# Patient Record
Sex: Female | Born: 1995 | Race: White | Hispanic: No | Marital: Single | Smoking: Former smoker
Health system: Southern US, Community
[De-identification: ages and names within clinical notes are randomized; demographics above are authoritative.]

## PROBLEM LIST (undated history)

## (undated) ENCOUNTER — Inpatient Hospital Stay (HOSPITAL_COMMUNITY): Payer: Self-pay

## (undated) DIAGNOSIS — Z8669 Personal history of other diseases of the nervous system and sense organs: Secondary | ICD-10-CM

## (undated) DIAGNOSIS — O139 Gestational [pregnancy-induced] hypertension without significant proteinuria, unspecified trimester: Secondary | ICD-10-CM

---
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
## 2016-11-03 ENCOUNTER — Other Ambulatory Visit (HOSPITAL_COMMUNITY)
Admission: RE | Admit: 2016-11-03 | Discharge: 2016-11-03 | Disposition: A | Discharge status: Home / Self Care | Payer: Medicaid Other | Source: Ambulatory Visit | Attending: Obstetrics & Gynecology | Admitting: Obstetrics & Gynecology

## 2016-11-03 ENCOUNTER — Ambulatory Visit (INDEPENDENT_AMBULATORY_CARE_PROVIDER_SITE_OTHER): Payer: Medicaid Other | Admitting: Obstetrics & Gynecology

## 2016-11-03 ENCOUNTER — Encounter: Payer: Self-pay | Admitting: Obstetrics & Gynecology

## 2016-11-03 VITALS — BP 117/80 | HR 79 | Ht 63.5 in | Wt 139.0 lb

## 2016-11-03 DIAGNOSIS — Z3400 Encounter for supervision of normal first pregnancy, unspecified trimester: Secondary | ICD-10-CM | POA: Diagnosis present

## 2016-11-03 DIAGNOSIS — Z3A00 Weeks of gestation of pregnancy not specified: Secondary | ICD-10-CM | POA: Diagnosis not present

## 2016-11-03 DIAGNOSIS — G959 Disease of spinal cord, unspecified: Secondary | ICD-10-CM

## 2016-11-03 DIAGNOSIS — O98819 Other maternal infectious and parasitic diseases complicating pregnancy, unspecified trimester: Secondary | ICD-10-CM | POA: Insufficient documentation

## 2016-11-03 DIAGNOSIS — Z8669 Personal history of other diseases of the nervous system and sense organs: Secondary | ICD-10-CM

## 2016-11-03 DIAGNOSIS — B9689 Other specified bacterial agents as the cause of diseases classified elsewhere: Secondary | ICD-10-CM | POA: Insufficient documentation

## 2016-11-03 DIAGNOSIS — Z3401 Encounter for supervision of normal first pregnancy, first trimester: Secondary | ICD-10-CM | POA: Diagnosis not present

## 2016-11-03 DIAGNOSIS — N760 Acute vaginitis: Secondary | ICD-10-CM | POA: Diagnosis not present

## 2016-11-03 HISTORY — DX: Personal history of other diseases of the nervous system and sense organs: Z86.69

## 2016-11-03 NOTE — Patient Instructions (Signed)
First Trimester of Pregnancy The first trimester of pregnancy is from week 1 until the end of week 13 (months 1 through 3). A week after a sperm fertilizes an egg, the egg will implant on the wall of the uterus. This embryo will begin to develop into a baby. Genes from you and your partner will form the baby. The female genes will determine whether the baby will be a boy or a girl. At 6-8 weeks, the eyes and face will be formed, and the heartbeat can be seen on ultrasound. At the end of 12 weeks, all the baby's organs will be formed. Now that you are pregnant, you will want to do everything you can to have a healthy baby. Two of the most important things are to get good prenatal care and to follow your health care provider's instructions. Prenatal care is all the medical care you receive before the baby's birth. This care will help prevent, find, and treat any problems during the pregnancy and childbirth. Body changes during your first trimester Your body goes through many changes during pregnancy. The changes vary from woman to woman.  You may gain or lose a couple of pounds at first.  You may feel sick to your stomach (nauseous) and you may throw up (vomit). If the vomiting is uncontrollable, call your health care provider.  You may tire easily.  You may develop headaches that can be relieved by medicines. All medicines should be approved by your health care provider.  You may urinate more often. Painful urination may mean you have a bladder infection.  You may develop heartburn as a result of your pregnancy.  You may develop constipation because certain hormones are causing the muscles that push stool through your intestines to slow down.  You may develop hemorrhoids or swollen veins (varicose veins).  Your breasts may begin to grow larger and become tender. Your nipples may stick out more, and the tissue that surrounds them (areola) may become darker.  Your gums may bleed and may be  sensitive to brushing and flossing.  Dark spots or blotches (chloasma, mask of pregnancy) may develop on your face. This will likely fade after the baby is born.  Your menstrual periods will stop.  You may have a loss of appetite.  You may develop cravings for certain kinds of food.  You may have changes in your emotions from day to day, such as being excited to be pregnant or being concerned that something may go wrong with the pregnancy and baby.  You may have more vivid and strange dreams.  You may have changes in your hair. These can include thickening of your hair, rapid growth, and changes in texture. Some women also have hair loss during or after pregnancy, or hair that feels dry or thin. Your hair will most likely return to normal after your baby is born.  What to expect at prenatal visits During a routine prenatal visit:  You will be weighed to make sure you and the baby are growing normally.  Your blood pressure will be taken.  Your abdomen will be measured to track your baby's growth.  The fetal heartbeat will be listened to between weeks 10 and 14 of your pregnancy.  Test results from any previous visits will be discussed.  Your health care provider may ask you:  How you are feeling.  If you are feeling the baby move.  If you have had any abnormal symptoms, such as leaking fluid, bleeding, severe headaches,   or abdominal cramping.  If you are using any tobacco products, including cigarettes, chewing tobacco, and electronic cigarettes.  If you have any questions.  Other tests that may be performed during your first trimester include:  Blood tests to find your blood type and to check for the presence of any previous infections. The tests will also be used to check for low iron levels (anemia) and protein on red blood cells (Rh antibodies). Depending on your risk factors, or if you previously had diabetes during pregnancy, you may have tests to check for high blood  sugar that affects pregnant women (gestational diabetes).  Urine tests to check for infections, diabetes, or protein in the urine.  An ultrasound to confirm the proper growth and development of the baby.  Fetal screens for spinal cord problems (spina bifida) and Down syndrome.  HIV (human immunodeficiency virus) testing. Routine prenatal testing includes screening for HIV, unless you choose not to have this test.  You may need other tests to make sure you and the baby are doing well.  Follow these instructions at home: Medicines  Follow your health care provider's instructions regarding medicine use. Specific medicines may be either safe or unsafe to take during pregnancy.  Take a prenatal vitamin that contains at least 600 micrograms (mcg) of folic acid.  If you develop constipation, try taking a stool softener if your health care provider approves. Eating and drinking  Eat a balanced diet that includes fresh fruits and vegetables, whole grains, good sources of protein such as meat, eggs, or tofu, and low-fat dairy. Your health care provider will help you determine the amount of weight gain that is right for you.  Avoid raw meat and uncooked cheese. These carry germs that can cause birth defects in the baby.  Eating four or five small meals rather than three large meals a day may help relieve nausea and vomiting. If you start to feel nauseous, eating a few soda crackers can be helpful. Drinking liquids between meals, instead of during meals, also seems to help ease nausea and vomiting.  Limit foods that are high in fat and processed sugars, such as fried and sweet foods.  To prevent constipation: ? Eat foods that are high in fiber, such as fresh fruits and vegetables, whole grains, and beans. ? Drink enough fluid to keep your urine clear or pale yellow. Activity  Exercise only as directed by your health care provider. Most women can continue their usual exercise routine during  pregnancy. Try to exercise for 30 minutes at least 5 days a week. Exercising will help you: ? Control your weight. ? Stay in shape. ? Be prepared for labor and delivery.  Experiencing pain or cramping in the lower abdomen or lower back is a good sign that you should stop exercising. Check with your health care provider before continuing with normal exercises.  Try to avoid standing for long periods of time. Move your legs often if you must stand in one place for a long time.  Avoid heavy lifting.  Wear low-heeled shoes and practice good posture.  You may continue to have sex unless your health care provider tells you not to. Relieving pain and discomfort  Wear a good support bra to relieve breast tenderness.  Take warm sitz baths to soothe any pain or discomfort caused by hemorrhoids. Use hemorrhoid cream if your health care provider approves.  Rest with your legs elevated if you have leg cramps or low back pain.  If you develop   varicose veins in your legs, wear support hose. Elevate your feet for 15 minutes, 3-4 times a day. Limit salt in your diet. Prenatal care  Schedule your prenatal visits by the twelfth week of pregnancy. They are usually scheduled monthly at first, then more often in the last 2 months before delivery.  Write down your questions. Take them to your prenatal visits.  Keep all your prenatal visits as told by your health care provider. This is important. Safety  Wear your seat belt at all times when driving.  Make a list of emergency phone numbers, including numbers for family, friends, the hospital, and police and fire departments. General instructions  Ask your health care provider for a referral to a local prenatal education class. Begin classes no later than the beginning of month 6 of your pregnancy.  Ask for help if you have counseling or nutritional needs during pregnancy. Your health care provider can offer advice or refer you to specialists for help  with various needs.  Do not use hot tubs, steam rooms, or saunas.  Do not douche or use tampons or scented sanitary pads.  Do not cross your legs for long periods of time.  Avoid cat litter boxes and soil used by cats. These carry germs that can cause birth defects in the baby and possibly loss of the fetus by miscarriage or stillbirth.  Avoid all smoking, herbs, alcohol, and medicines not prescribed by your health care provider. Chemicals in these products affect the formation and growth of the baby.  Do not use any products that contain nicotine or tobacco, such as cigarettes and e-cigarettes. If you need help quitting, ask your health care provider. You may receive counseling support and other resources to help you quit.  Schedule a dentist appointment. At home, brush your teeth with a soft toothbrush and be gentle when you floss. Contact a health care provider if:  You have dizziness.  You have mild pelvic cramps, pelvic pressure, or nagging pain in the abdominal area.  You have persistent nausea, vomiting, or diarrhea.  You have a bad smelling vaginal discharge.  You have pain when you urinate.  You notice increased swelling in your face, hands, legs, or ankles.  You are exposed to fifth disease or chickenpox.  You are exposed to German measles (rubella) and have never had it. Get help right away if:  You have a fever.  You are leaking fluid from your vagina.  You have spotting or bleeding from your vagina.  You have severe abdominal cramping or pain.  You have rapid weight gain or loss.  You vomit blood or material that looks like coffee grounds.  You develop a severe headache.  You have shortness of breath.  You have any kind of trauma, such as from a fall or a car accident. Summary  The first trimester of pregnancy is from week 1 until the end of week 13 (months 1 through 3).  Your body goes through many changes during pregnancy. The changes vary from  woman to woman.  You will have routine prenatal visits. During those visits, your health care provider will examine you, discuss any test results you may have, and talk with you about how you are feeling. This information is not intended to replace advice given to you by your health care provider. Make sure you discuss any questions you have with your health care provider. Document Released: 01/27/2001 Document Revised: 01/15/2016 Document Reviewed: 01/15/2016 Elsevier Interactive Patient Education  2017 [employer]   Inc.  

## 2016-11-03 NOTE — Progress Notes (Signed)
Subjective:    Allison Heath is a G1P0 [redacted]w[redacted]d being seen today for her first obstetrical visit.  Her obstetrical history is significant for first pregnancy with unsure LMP. Patient does intend to breast feed. Pregnancy history fully reviewed.  Patient reports nausea.  Vitals:   11/03/16 1010 11/03/16 1011  BP: 117/80   Pulse: 79   Weight: 139 lb (63 kg)   Height:  5' 3.5" (1.613 m)    HISTORY: OB History  Gravida Para Term Preterm AB Living  1            SAB TAB Ectopic Multiple Live Births               # Outcome Date GA Lbr Len/2nd Weight Sex Delivery Anes PTL Lv  1 Current              Past Medical History:  Diagnosis Date  . Supervision of normal first pregnancy, antepartum 11/03/2016    Clinic [facility] Prenatal Labs Dating LMP Blood type:    Genetic Screen 1 Screen:    AFP:     Quad:     NIPS: Antibody:  Anatomic [place]  Rubella:   GTT Early:               Third trimester:  RPR:    Flu vaccine  HBsAg:    TDaP vaccine                                               Rhogam: HIV:    Baby Food Breast                                              GBS: (For PCN allergy, check sensitivities) Contraception Pill? Pap: Circumcision Yes if a boy  Pediatrician Undecided CF: Support Person Cody-FOB Susan-Mom SMA Prenatal Classes Yes Hgb electrophoresis:     Past Surgical History:  Procedure Laterality Date  . birth mark removal  1997  . SPINAL CORD UNTETHERING  2006   Family History  Problem Relation Age of Onset  . Multiple sclerosis Mother   . Addison's disease Mother   . Graves' disease Mother   . Fibromyalgia Mother   . Bone cancer Maternal Grandmother   . Emphysema Maternal Grandfather      Exam    Uterus:   11-2 week  Pelvic Exam:    Perineum: No Hemorrhoids   Vulva: normal   Vagina:  normal mucosa   pH:     Cervix: no lesions   Adnexa: normal adnexa   Bony Pelvis: average  System: Breast:  normal appearance, no masses or tenderness   Skin: normal coloration and  turgor, no rashes    Neurologic: oriented, normal mood   Extremities: normal strength, tone, and muscle mass   HEENT PERRLA, extra ocular movement intact, sclera clear, anicteric, oropharynx clear, no lesions, neck supple with midline trachea and thyroid without masses   Mouth/Teeth mucous membranes moist, pharynx normal without lesions and dental hygiene good   Neck supple   Cardiovascular: regular rate and rhythm, no murmurs or gallops   Respiratory:  appears well, vitals normal, no respiratory distress, acyanotic, normal RR, neck free of mass or lymphadenopathy, chest clear, no wheezing, crepitations, rhonchi,  normal symmetric air entry   Abdomen: soft, non-tender; bowel sounds normal; no masses,  no organomegaly   Urinary: urethral meatus normal      Assessment:    Pregnancy: G1P0 Patient Active Problem List   Diagnosis Date Noted  . Supervision of normal first pregnancy, antepartum 11/03/2016        Plan:     Initial labs drawn. Prenatal vitamins. Problem list reviewed and updated. Genetic Screening discussed First Screen: ordered.  Ultrasound discussed; fetal survey: 18+ weeks.  Follow up in 4 weeks. 50% of 30 min visit spent on counseling and coordination of care.     [name] 11/03/2016

## 2016-11-05 LAB — CULTURE, OB URINE

## 2016-11-05 LAB — CERVICOVAGINAL ANCILLARY ONLY
BACTERIAL VAGINITIS: POSITIVE — AB
Candida vaginitis: NEGATIVE
Chlamydia: NEGATIVE
NEISSERIA GONORRHEA: NEGATIVE
TRICH (WINDOWPATH): NEGATIVE

## 2016-11-05 LAB — URINE CULTURE, OB REFLEX

## 2016-11-05 LAB — CYTOLOGY - PAP: Diagnosis: NEGATIVE

## 2016-11-10 LAB — OBSTETRIC PANEL, INCLUDING HIV
Antibody Screen: NEGATIVE
BASOS ABS: 0 10*3/uL (ref 0.0–0.2)
Basos: 0 %
EOS (ABSOLUTE): 0 10*3/uL (ref 0.0–0.4)
Eos: 1 %
HIV SCREEN 4TH GENERATION: NONREACTIVE
Hematocrit: 33.9 % — ABNORMAL LOW (ref 34.0–46.6)
Hemoglobin: 11.3 g/dL (ref 11.1–15.9)
Hepatitis B Surface Ag: NEGATIVE
Immature Grans (Abs): 0 10*3/uL (ref 0.0–0.1)
Immature Granulocytes: 0 %
LYMPHS ABS: 1.7 10*3/uL (ref 0.7–3.1)
Lymphs: 28 %
MCH: 32.8 pg (ref 26.6–33.0)
MCHC: 33.3 g/dL (ref 31.5–35.7)
MCV: 99 fL — AB (ref 79–97)
Monocytes Absolute: 0.3 10*3/uL (ref 0.1–0.9)
Monocytes: 6 %
NEUTROS ABS: 3.9 10*3/uL (ref 1.4–7.0)
Neutrophils: 65 %
PLATELETS: 223 10*3/uL (ref 150–379)
RBC: 3.44 x10E6/uL — ABNORMAL LOW (ref 3.77–5.28)
RDW: 13.2 % (ref 12.3–15.4)
RPR Ser Ql: NONREACTIVE
Rh Factor: POSITIVE
Rubella Antibodies, IGG: 5.68 index (ref >0.99)
WBC: 6 10*3/uL (ref 3.4–10.8)

## 2016-11-10 LAB — CYSTIC FIBROSIS MUTATION 97: GENE DIS ANAL CARRIER INTERP BLD/T-IMP: NOT DETECTED

## 2016-11-10 LAB — VITAMIN D 25 HYDROXY (VIT D DEFICIENCY, FRACTURES): Vit D, 25-Hydroxy: 27 ng/mL — ABNORMAL LOW (ref 30.0–100.0)

## 2016-11-10 LAB — VARICELLA ZOSTER ANTIBODY, IGG: Varicella zoster IgG: 499 index (ref >165)

## 2016-11-18 ENCOUNTER — Encounter (HOSPITAL_COMMUNITY): Payer: Self-pay

## 2016-11-18 ENCOUNTER — Ambulatory Visit (HOSPITAL_COMMUNITY)
Admission: RE | Admit: 2016-11-18 | Discharge: 2016-11-18 | Disposition: A | Discharge status: Home / Self Care | Payer: Medicaid Other | Source: Ambulatory Visit | Attending: Obstetrics & Gynecology | Admitting: Obstetrics & Gynecology

## 2016-11-18 DIAGNOSIS — Z3400 Encounter for supervision of normal first pregnancy, unspecified trimester: Secondary | ICD-10-CM

## 2016-11-18 DIAGNOSIS — Z3A13 13 weeks gestation of pregnancy: Secondary | ICD-10-CM | POA: Insufficient documentation

## 2016-11-18 DIAGNOSIS — Z3682 Encounter for antenatal screening for nuchal translucency: Secondary | ICD-10-CM | POA: Diagnosis not present

## 2016-11-23 ENCOUNTER — Other Ambulatory Visit: Payer: Self-pay

## 2016-11-23 DIAGNOSIS — Z3400 Encounter for supervision of normal first pregnancy, unspecified trimester: Secondary | ICD-10-CM

## 2016-12-01 ENCOUNTER — Ambulatory Visit (INDEPENDENT_AMBULATORY_CARE_PROVIDER_SITE_OTHER): Payer: Medicaid Other | Admitting: Obstetrics & Gynecology

## 2016-12-01 VITALS — BP 108/68 | HR 89 | Wt 146.0 lb

## 2016-12-01 DIAGNOSIS — Z3400 Encounter for supervision of normal first pregnancy, unspecified trimester: Secondary | ICD-10-CM

## 2016-12-01 DIAGNOSIS — Z3402 Encounter for supervision of normal first pregnancy, second trimester: Secondary | ICD-10-CM

## 2016-12-01 DIAGNOSIS — Z23 Encounter for immunization: Secondary | ICD-10-CM | POA: Diagnosis not present

## 2016-12-01 NOTE — Patient Instructions (Addendum)

## 2016-12-01 NOTE — Progress Notes (Signed)
   PRENATAL VISIT NOTE  Subjective:  Allison Heath is a 21 y.o. G1P0 at [redacted]w[redacted]d being seen today for ongoing prenatal care.  She is currently monitored for the following issues for this low-risk pregnancy and has Supervision of normal first pregnancy, antepartum and History of tethered spinal cord on her problem list.  Patient reports no complaints.  Contractions: Not present. Vag. Bleeding: None.  Movement: Present. Denies leaking of fluid.   The following portions of the patient's history were reviewed and updated as appropriate: allergies, current medications, past family history, past medical history, past social history, past surgical history and problem list. Problem list updated.  Objective:   Vitals:   12/01/16 0958  BP: 108/68  Pulse: 89  Weight: 66.2 kg (146 lb)    Fetal Status:     Movement: Present     General:  Alert, oriented and cooperative. Patient is in no acute distress.  Skin: Skin is warm and dry. No rash noted.   Cardiovascular: Normal heart rate noted  Respiratory: Normal respiratory effort, no problems with respiration noted  Abdomen: Soft, gravid, appropriate for gestational age.  Pain/Pressure: Absent     Pelvic: Cervical exam deferred        Extremities: Normal range of motion.  Edema: None  Mental Status:  Normal mood and affect. Normal behavior. Normal judgment and thought content.   Assessment and Plan:  Pregnancy: G1P0 at [redacted]w[redacted]d  1. Supervision of normal first pregnancy, antepartum  - Flu Vaccine QUAD 36+ mos IM (Fluarix, Quad PF) - [place] MFM OB COMP + 14 WK; Future - AFP, Serum, Open Spina Bifida  Preterm labor symptoms and general obstetric precautions including but not limited to vaginal bleeding, contractions, leaking of fluid and fetal movement were reviewed in detail with the patient. Please refer to After Visit Summary for other counseling recommendations.  Return in about 4 weeks (around 12/29/2016).   [name], MD

## 2016-12-03 ENCOUNTER — Other Ambulatory Visit: Payer: Self-pay

## 2016-12-03 MED ORDER — METRONIDAZOLE 500 MG PO TABS: 500.0000 mg | ORAL_TABLET | Freq: Two times a day (BID) | ORAL | 0 refills | Status: AC

## 2016-12-10 ENCOUNTER — Encounter (HOSPITAL_COMMUNITY): Payer: Self-pay | Admitting: Emergency Medicine

## 2016-12-10 ENCOUNTER — Emergency Department (HOSPITAL_COMMUNITY)
Admission: EM | Admit: 2016-12-10 | Discharge: 2016-12-10 | Disposition: A | Discharge status: Home / Self Care | Payer: Medicaid Other | Attending: Emergency Medicine | Admitting: Emergency Medicine

## 2016-12-10 DIAGNOSIS — F17200 Nicotine dependence, unspecified, uncomplicated: Secondary | ICD-10-CM | POA: Diagnosis not present

## 2016-12-10 DIAGNOSIS — O99332 Smoking (tobacco) complicating pregnancy, second trimester: Secondary | ICD-10-CM | POA: Insufficient documentation

## 2016-12-10 DIAGNOSIS — R109 Unspecified abdominal pain: Secondary | ICD-10-CM

## 2016-12-10 DIAGNOSIS — O26892 Other specified pregnancy related conditions, second trimester: Secondary | ICD-10-CM

## 2016-12-10 DIAGNOSIS — R110 Nausea: Secondary | ICD-10-CM | POA: Insufficient documentation

## 2016-12-10 DIAGNOSIS — O9989 Other specified diseases and conditions complicating pregnancy, childbirth and the puerperium: Secondary | ICD-10-CM | POA: Diagnosis not present

## 2016-12-10 DIAGNOSIS — Z3A16 16 weeks gestation of pregnancy: Secondary | ICD-10-CM | POA: Diagnosis not present

## 2016-12-10 DIAGNOSIS — R1033 Periumbilical pain: Secondary | ICD-10-CM | POA: Diagnosis not present

## 2016-12-10 NOTE — ED Notes (Signed)
ED Provider at bedside. 

## 2016-12-10 NOTE — Discharge Instructions (Signed)
You can take Tylenol or ibuprofen for pain. Follow closely with Dr. Debroah LoopArnold and return for emergent evaluation (here [facility] if symptoms change or you have new concerns.

## 2016-12-10 NOTE — ED Provider Notes (Signed)
[facility]-EMERGENCY DEPT Provider Note   CSN: [id number] Arrival date & time: 12/10/16  2044     History   Chief Complaint Chief Complaint  Patient presents with  . Abdominal Cramping    HPI Allison Heath is a 21 y.o. female.  Patient presents with abdominal cramping pain in periumbilical abdomen for the past 3 hours. She is approximately [redacted] weeks pregnant in low-risk pregnancy, followed by Dr. Debroah LoopArnold. No vaginal bleeding or fluid leakage. The baby remains active. She has nausea but this is unchanged from nausea experienced during the pregnancy already. No fever, vomiting. She reports she may be constipated but continues to have bowel movements. She has been taking Flagyl for 5 days for BV and reports symptoms of discharge on initial diagnosis have resolved.    The history is provided by the patient and the spouse. No language interpreter was used.  Abdominal Cramping  Associated symptoms include abdominal pain. Pertinent negatives include no chest pain and no shortness of breath.    Past Medical History:  Diagnosis Date  . Medical history non-contributory   . Supervision of normal first pregnancy, antepartum 11/03/2016    Clinic [facility] Prenatal Labs Dating LMP Blood type:    Genetic Screen 1 Screen:    AFP:     Quad:     NIPS: Antibody:  Anatomic US  Rubella:   GTT Early:               Third trimester:  RPR:    Flu vaccine  HBsAg:    TDaP vaccine                                               Rhogam: HIV:    Baby Food Breast                                              GBS: (For PCN allergy, check sensitivities) Contraception Pill? Pap: Circumcision Yes if a boy  Pediatrician Undecided CF: Support Person Cody-FOB Susan-Mom SMA Prenatal Classes Yes Hgb electrophoresis:      Patient Active Problem List   Diagnosis Date Noted  . Supervision of normal first pregnancy, antepartum 11/03/2016  . History of tethered spinal cord 11/03/2016    Past Surgical  History:  Procedure Laterality Date  . birth mark removal  1997  . SPINAL CORD UNTETHERING  2006    OB History    Gravida Para Term Preterm AB Living   1             SAB TAB Ectopic Multiple Live Births                   Home Medications    Prior to Admission medications   Medication Sig Start Date End Date Taking? Authorizing Provider  metroNIDAZOLE (FLAGYL) 500 MG tablet Take 1 tablet (500 mg total) by mouth 2 (two) times daily. 12/03/16 12/10/16 Yes Brock BadHarper, Charles A, MD  Prenatal Vit-Fe Fumarate-FA (PRENATAL MULTIVITAMIN) TABS tablet Take 1 tablet by mouth daily.    Yes [provider]    Family History Family History  Problem Relation Age of Onset  . Multiple sclerosis Mother   . Addison's disease Mother   . Graves' disease  Mother   . Fibromyalgia Mother   . Bone cancer Maternal Grandmother   . Emphysema Maternal Grandfather     Social History Social History  Substance Use Topics  . Smoking status: Former Smoker    Quit date: 07/2016  . Smokeless tobacco: Never Used  . Alcohol use No     Allergies   Patient has no known allergies.   Review of Systems Review of Systems  Constitutional: Negative for chills and fever.  Respiratory: Negative.  Negative for shortness of breath.   Cardiovascular: Negative.  Negative for chest pain.  Gastrointestinal: Positive for abdominal distention, abdominal pain and nausea. Negative for blood in stool, diarrhea and vomiting.  Genitourinary: Negative.   Musculoskeletal: Negative.  Negative for back pain.  Neurological: Negative.      Physical Exam Updated Vital Signs BP 137/89 (BP Location: Left Arm)   Pulse 75   Temp 98.3 F (36.8 C) (Oral)   Resp 18   Ht 5\' 3"  (1.6 m)   Wt 67.6 kg (149 lb)   LMP 08/19/2016 (Approximate)   SpO2 100%   BMI 26.39 kg/m   Physical Exam  Constitutional: She is oriented to person, place, and time. She appears well-developed and well-nourished.  HENT:  Head:  Normocephalic.  Neck: Normal range of motion. Neck supple.  Cardiovascular: Normal rate.   Pulmonary/Chest: Effort normal.  Abdominal: Soft. Bowel sounds are normal. There is tenderness (Mild periumbilical tenderness to gravid but soft abdomen.). There is no rebound and no guarding.  Musculoskeletal: Normal range of motion.  Neurological: She is alert and oriented to person, place, and time.  Skin: Skin is warm and dry. No rash noted.  Psychiatric: She has a normal mood and affect.     ED Treatments / Results  Labs (all labs ordered are listed, but only abnormal results are displayed) Labs Reviewed - No data to display  EKG  EKG Interpretation None       Radiology No results found.  Procedures Procedures (including critical care time)  Medications Ordered in ED Medications - No data to display   Initial Impression / Assessment and Plan / ED Course  I have reviewed the triage vital signs and the nursing notes.  Pertinent labs & imaging results that were available during my care of the patient were reviewed by me and considered in my medical decision making (see chart for details).     16 week G1P0 patient presents with abdominal cramping that started 3 hours ago. Good fetal movement. FHT's of 150 in ED.   Discussed the patient with Dr. Debroah Loop [facility]) who advises nothing more needs to be done currently. She is having regular OB prenatal care and is considered low-risk. No bleeding or fluid leakage. Good FHT's and movement - no [place] indicated. He reports, based on presentation, round ligament pain is likely requiring patient reassurance, Tylenol or ibuprofen and office follow up.   She can be discharged home. Return precautions discussed. She is told she can go to [facility] for emergent concerns regarding the pregnancy but is also told she can return here anytime she feels she needs medical care.   Final Clinical Impressions(s) / ED Diagnoses   Final diagnoses:  None   1.  Abdominal cramping.  New Prescriptions New Prescriptions   No medications on file     Elpidio Anis, Cordelia Poche 12/10/16 2250    Raeford Razor, MD 12/15/16 [phone]

## 2016-12-10 NOTE — ED Triage Notes (Addendum)
Pt reports having abdominal cramping that started appx 45 min ago that started under ribs and proceeded all over abdomen that feels tight. Pt reports being [redacted]weeks pregnant and is being seen by OB/GYN for prenatal care. Pt denies any vaginal bleeding.

## 2016-12-23 ENCOUNTER — Ambulatory Visit (HOSPITAL_COMMUNITY)
Admission: RE | Admit: 2016-12-23 | Discharge: 2016-12-23 | Disposition: A | Discharge status: Home / Self Care | Payer: Medicaid Other | Source: Ambulatory Visit | Attending: Obstetrics & Gynecology | Admitting: Obstetrics & Gynecology

## 2016-12-23 ENCOUNTER — Encounter (HOSPITAL_COMMUNITY): Payer: Self-pay

## 2016-12-23 ENCOUNTER — Other Ambulatory Visit: Payer: Self-pay | Admitting: Obstetrics & Gynecology

## 2016-12-23 DIAGNOSIS — Z3400 Encounter for supervision of normal first pregnancy, unspecified trimester: Secondary | ICD-10-CM

## 2016-12-23 DIAGNOSIS — Z363 Encounter for antenatal screening for malformations: Secondary | ICD-10-CM | POA: Diagnosis present

## 2016-12-23 DIAGNOSIS — Z9889 Other specified postprocedural states: Secondary | ICD-10-CM | POA: Insufficient documentation

## 2016-12-23 DIAGNOSIS — Z3A18 18 weeks gestation of pregnancy: Secondary | ICD-10-CM | POA: Diagnosis not present

## 2016-12-23 DIAGNOSIS — O2692 Pregnancy related conditions, unspecified, second trimester: Secondary | ICD-10-CM | POA: Insufficient documentation

## 2016-12-23 DIAGNOSIS — O09892 Supervision of other high risk pregnancies, second trimester: Secondary | ICD-10-CM | POA: Insufficient documentation

## 2016-12-23 DIAGNOSIS — Z8669 Personal history of other diseases of the nervous system and sense organs: Secondary | ICD-10-CM | POA: Diagnosis present

## 2016-12-23 DIAGNOSIS — Z87798 Personal history of other (corrected) congenital malformations: Secondary | ICD-10-CM | POA: Insufficient documentation

## 2016-12-24 LAB — AFP, SERUM, OPEN SPINA BIFIDA
AFP MoM: 1.02
AFP Value: 34.1 ng/mL
GEST. AGE ON COLLECTION DATE: 16 wk
MATERNAL AGE AT EDD: 24.1 a
OSBR RISK 1 IN: 10000
TEST RESULTS AFP: NEGATIVE
Weight: 146 [lb_av]

## 2016-12-29 ENCOUNTER — Encounter: Payer: Self-pay | Admitting: Obstetrics and Gynecology

## 2016-12-29 ENCOUNTER — Ambulatory Visit (INDEPENDENT_AMBULATORY_CARE_PROVIDER_SITE_OTHER): Payer: Medicaid Other | Admitting: Obstetrics and Gynecology

## 2016-12-29 VITALS — BP 115/79 | HR 75 | Wt 156.0 lb

## 2016-12-29 DIAGNOSIS — Z3400 Encounter for supervision of normal first pregnancy, unspecified trimester: Secondary | ICD-10-CM

## 2016-12-29 NOTE — Progress Notes (Signed)
Subjective:  Allison Heath is a 21 y.o. G1P0 at 1044w6d being seen today for ongoing prenatal care.  She is currently monitored for the following issues for this low risk pregnancy and has Supervision of normal first pregnancy, antepartum and History of tethered spinal cord on their problem list.  Patient reports no complaints.  Contractions: Not present. Vag. Bleeding: None.  Movement: Present. Denies leaking of fluid.   The following portions of the patient's history were reviewed and updated as appropriate: allergies, current medications, past family history, past medical history, past social history, past surgical history and problem list. Problem list updated.  Objective:   Vitals:   12/29/16 1354  BP: 115/79  Pulse: 75  Weight: 156 lb (70.8 kg)    Fetal Status: Fetal Heart Rate (bpm): 152   Movement: Present     General:  Alert, oriented and cooperative. Patient is in no acute distress.  Skin: Skin is warm and dry. No rash noted.   Cardiovascular: Normal heart rate noted  Respiratory: Normal respiratory effort, no problems with respiration noted  Abdomen: Soft, gravid, appropriate for gestational age. Pain/Pressure: Absent     Pelvic:  Cervical exam deferred        Extremities: Normal range of motion.  Edema: Trace  Mental Status: Normal mood and affect. Normal behavior. Normal judgment and thought content.   Urinalysis:      Assessment and Plan:  Pregnancy: G1P0 at 7444w6d  1. Supervision of normal first pregnancy, antepartum Stable  Preterm labor symptoms and general obstetric precautions including but not limited to vaginal bleeding, contractions, leaking of fluid and fetal movement were reviewed in detail with the patient. Please refer to After Visit Summary for other counseling recommendations.  Return in about 4 weeks (around 01/26/2017) for OB visit.   Hermina StaggersErvin, Luverna Degenhart L, MD

## 2016-12-29 NOTE — Patient Instructions (Signed)

## 2017-01-26 ENCOUNTER — Encounter: Payer: Medicaid Other | Admitting: Obstetrics & Gynecology

## 2017-02-02 ENCOUNTER — Ambulatory Visit (INDEPENDENT_AMBULATORY_CARE_PROVIDER_SITE_OTHER): Payer: Medicaid Other | Admitting: Obstetrics & Gynecology

## 2017-02-02 DIAGNOSIS — Z3400 Encounter for supervision of normal first pregnancy, unspecified trimester: Secondary | ICD-10-CM

## 2017-02-02 MED ORDER — PANTOPRAZOLE SODIUM 20 MG PO TBEC: 20.0000 mg | DELAYED_RELEASE_TABLET | Freq: Every day | ORAL | 3 refills | Status: AC

## 2017-02-02 NOTE — Progress Notes (Signed)
   PRENATAL VISIT NOTE  Subjective:  Allison Heath is a 21 y.o. G1P0 at 3148w2d being seen today for ongoing prenatal care.  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  Patient reports backache and heartburn.  Contractions: Not present. Vag. Bleeding: None.  Movement: Present. Denies leaking of fluid.   The following portions of the patient's history were reviewed and updated as appropriate: allergies, current medications, past family history, past medical history, past social history, past surgical history and problem list. Problem list updated.  Objective:   Vitals:   02/02/17 1429  BP: 127/85  Pulse: 89  Weight: 166 lb (75.3 kg)    Fetal Status: Fetal Heart Rate (bpm): 145 Fundal Height: 25 cm Movement: Present     General:  Alert, oriented and cooperative. Patient is in no acute distress.  Skin: Skin is warm and dry. No rash noted.   Cardiovascular: Normal heart rate noted  Respiratory: Normal respiratory effort, no problems with respiration noted  Abdomen: Soft, gravid, appropriate for gestational age.  Pain/Pressure: Absent     Pelvic: Cervical exam deferred        Extremities: Normal range of motion.  Edema: Trace  Mental Status:  Normal mood and affect. Normal behavior. Normal judgment and thought content.   Assessment and Plan:  Pregnancy: G1P0 at 7648w2d  1. Supervision of normal first pregnancy, antepartum Backache and heartburn - pantoprazole (PROTONIX) 20 MG tablet; Take 1 tablet (20 mg total) by mouth daily.  Dispense: 30 tablet; Refill: 3  Preterm labor symptoms and general obstetric precautions including but not limited to vaginal bleeding, contractions, leaking of fluid and fetal movement were reviewed in detail with the patient. Please refer to After Visit Summary for other counseling recommendations.  Return  in about 4 weeks (around 03/02/2017) for 2 hr gtt.   Scheryl DarterJames Akera Snowberger, MD

## 2017-02-02 NOTE — Patient Instructions (Addendum)
Second Trimester of Pregnancy  The second trimester is from week 13 through week 28, month 4 through 6. This is often the time in pregnancy that you feel your best. Often times, morning sickness has lessened or quit. You may have more energy, and you may get hungry more often. Your unborn baby (fetus) is growing rapidly. At the end of the sixth month, he or she is about 9 inches long and weighs about 1½ pounds. You will likely feel the baby move (quickening) between 18 and 20 weeks of pregnancy.  Follow these instructions at home:  · Avoid all smoking, herbs, and alcohol. Avoid drugs not approved by your doctor.  · Do not use any tobacco products, including cigarettes, chewing tobacco, and electronic cigarettes. If you need help quitting, ask your doctor. You may get counseling or other support to help you quit.  · Only take medicine as told by your doctor. Some medicines are safe and some are not during pregnancy.  · Exercise only as told by your doctor. Stop exercising if you start having cramps.  · Eat regular, healthy meals.  · Wear a good support bra if your breasts are tender.  · Do not use hot tubs, steam rooms, or saunas.  · Wear your seat belt when driving.  · Avoid raw meat, uncooked cheese, and liter boxes and soil used by cats.  · Take your prenatal vitamins.  · Take 1500–2000 milligrams of calcium daily starting at the 20th week of pregnancy until you deliver your baby.  · Try taking medicine that helps you poop (stool softener) as needed, and if your doctor approves. Eat more fiber by eating fresh fruit, vegetables, and whole grains. Drink enough fluids to keep your pee (urine) clear or pale yellow.  · Take warm water baths (sitz baths) to soothe pain or discomfort caused by hemorrhoids. Use hemorrhoid cream if your doctor approves.  · If you have puffy, bulging veins (varicose veins), wear support hose. Raise (elevate) your feet for 15 minutes, 3–4 times a day. Limit salt in your diet.   · Avoid heavy lifting, wear low heals, and sit up straight.  · Rest with your legs raised if you have leg cramps or low back pain.  · Visit your dentist if you have not gone during your pregnancy. Use a soft toothbrush to brush your teeth. Be gentle when you floss.  · You can have sex (intercourse) unless your doctor tells you not to.  · Go to your doctor visits.  Get help if:  · You feel dizzy.  · You have mild cramps or pressure in your lower belly (abdomen).  · You have a nagging pain in your belly area.  · You continue to feel sick to your stomach (nauseous), throw up (vomit), or have watery poop (diarrhea).  · You have bad smelling fluid coming from your vagina.  · You have pain with peeing (urination).  Get help right away if:  · You have a fever.  · You are leaking fluid from your vagina.  · You have spotting or bleeding from your vagina.  · You have severe belly cramping or pain.  · You lose or gain weight rapidly.  · You have trouble catching your breath and have chest pain.  · You notice sudden or extreme puffiness (swelling) of your face, hands, ankles, feet, or legs.  · You have not felt the baby move in over an hour.  · You have severe headaches that do   not go away with medicine.  · You have vision changes.  This information is not intended to replace advice given to you by your health care provider. Make sure you discuss any questions you have with your health care provider.  Document Released: 04/29/2009 Document Revised: 07/11/2015 Document Reviewed: 04/05/2012  Elsevier Interactive Patient Education © 2017 [employer]  Back Pain in Pregnancy  Back pain during pregnancy is common. Back pain may be caused by several factors that are related to changes during your pregnancy.  Follow these instructions at home:  Managing pain, stiffness, and swelling  · If directed, apply ice for sudden (acute) back pain.  ? Put ice in a plastic bag.  ? Place a towel between your skin and the bag.   ? Leave the ice on for 20 minutes, 2–3 times per day.  · If directed, apply heat to the affected area before you exercise:  ? Place a towel between your skin and the heat pack or heating pad.  ? Leave the heat on for 20–30 minutes.  ? Remove the heat if your skin turns bright red. This is especially important if you are unable to feel pain, heat, or cold. You may have a greater risk of getting burned.  Activity  · Exercise as told by your health care provider. Exercising is the best way to prevent or manage back pain.  · Listen to your body when lifting. If lifting hurts, ask for help or bend your knees. This uses your leg muscles instead of your back muscles.  · Squat down when picking up something from the floor. Do not bend over.  · Only use bed rest as told by your health care provider. Bed rest should only be used for the most severe episodes of back pain.  Standing, Sitting, and Lying Down  · Do not stand in one place for long periods of time.  · Use good posture when sitting. Make sure your head rests over your shoulders and is not hanging forward. Use a pillow on your lower back if necessary.  · Try sleeping on your side, preferably the left side, with a pillow or two between your legs. If you are sore after a night's rest, your bed may be too soft. A firm mattress may provide more support for your back during pregnancy.  General instructions  · Do not wear high heels.  · Eat a healthy diet. Try to gain weight within your health care provider's recommendations.  · Use a maternity girdle, elastic sling, or back brace as told by your health care provider.  · Take over-the-counter and prescription medicines only as told by your health care provider.  · Keep all follow-up visits as told by your health care provider. This is important. This includes any visits with any specialists, such as a physical therapist.  Contact a health care provider if:  · Your back pain interferes with your daily activities.   · You have increasing pain in other parts of your body.  Get help right away if:  · You develop numbness, tingling, weakness, or problems with the use of your arms or legs.  · You develop severe back pain that is not controlled with medicine.  · You have a sudden change in bowel or bladder control.  · You develop shortness of breath, dizziness, or you faint.  · You develop nausea, vomiting, or sweating.  · You have back pain that is a rhythmic, cramping pain similar to labor   pains. Labor pain is usually 1–2 minutes apart, lasts for about 1 minute, and involves a bearing down feeling or pressure in your pelvis.  · You have back pain and your water breaks or you have vaginal bleeding.  · You have back pain or numbness that travels down your leg.  · Your back pain developed after you fell.  · You develop pain on one side of your back.  · You see blood in your urine.  · You develop skin blisters in the area of your back pain.  This information is not intended to replace advice given to you by your health care provider. Make sure you discuss any questions you have with your health care provider.  Document Released: 05/13/2005 Document Revised: 07/11/2015 Document Reviewed: 10/17/2014  Elsevier Interactive Patient Education © 2018 Elsevier Inc.

## 2017-02-16 NOTE — L&D Delivery Note (Addendum)
Patient is a 22 y.o. now G1P1 s/p NSVD at 3021w4d, who was admitted for IOL for GHTN.  She progressed with augmentation (Foley bulb, AROM, Pitocin) to complete and pushed 1hr 42minutes to deliver. 1 minute shoulder dystocia- McRoberts and subrapubic pressure relieved anterior shoulder with ease. Spontaneous cry on delivery. Cord clamping delayed by several minutes then clamped by CNM and cut by FOB. She plans on breastfeeding. She unsure about birth control.  Delivery Note At 8:39 PM a viable female was delivered via Vaginal, Spontaneous with shoulder dystocia (Presentation: LOA ).  APGAR: 8, 9; weight pending .   Placenta intact and spontaneous, bleeding moderate. Cytotec 1,000mg  given Rectally. 3VCord.   Anesthesia:  Epidural Episiotomy: None Lacerations: 2nd degree;Labial repaired by Dr Doroteo GlassmanPhelps   Complications: Shoulder dystocia   Mom to postpartum.  Baby to Couplet care / Skin to Skin.  Sharyon CableVeronica C Rogers CNM 05/13/2017, 9:08 PM  EBL: 600mL  OB FELLOW DELIVERY ATTESTATION  I repaired bilateral labial and 2nd degree with good hemostasis. Patient also given 800mcg of cytotec PR for bleeding.   Caryl AdaJazma Jehiel Koepp, DO 9:18 PM

## 2017-02-18 ENCOUNTER — Inpatient Hospital Stay (HOSPITAL_COMMUNITY)
Admission: AD | Admit: 2017-02-18 | Discharge: 2017-02-18 | Disposition: A | Discharge status: Home / Self Care | Payer: Medicaid Other | Source: Ambulatory Visit | Attending: Obstetrics & Gynecology | Admitting: Obstetrics & Gynecology

## 2017-02-18 ENCOUNTER — Encounter (HOSPITAL_COMMUNITY): Payer: Self-pay | Admitting: *Deleted

## 2017-02-18 ENCOUNTER — Other Ambulatory Visit: Payer: Self-pay

## 2017-02-18 ENCOUNTER — Telehealth: Payer: Self-pay

## 2017-02-18 DIAGNOSIS — O1202 Gestational edema, second trimester: Secondary | ICD-10-CM | POA: Diagnosis not present

## 2017-02-18 DIAGNOSIS — O26892 Other specified pregnancy related conditions, second trimester: Secondary | ICD-10-CM | POA: Diagnosis not present

## 2017-02-18 DIAGNOSIS — O99352 Diseases of the nervous system complicating pregnancy, second trimester: Secondary | ICD-10-CM | POA: Insufficient documentation

## 2017-02-18 DIAGNOSIS — Z3689 Encounter for other specified antenatal screening: Secondary | ICD-10-CM | POA: Diagnosis not present

## 2017-02-18 DIAGNOSIS — Z3A26 26 weeks gestation of pregnancy: Secondary | ICD-10-CM

## 2017-02-18 DIAGNOSIS — M7989 Other specified soft tissue disorders: Secondary | ICD-10-CM | POA: Diagnosis not present

## 2017-02-18 DIAGNOSIS — Z3400 Encounter for supervision of normal first pregnancy, unspecified trimester: Secondary | ICD-10-CM

## 2017-02-18 DIAGNOSIS — G5600 Carpal tunnel syndrome, unspecified upper limb: Secondary | ICD-10-CM | POA: Diagnosis not present

## 2017-02-18 DIAGNOSIS — O26899 Other specified pregnancy related conditions, unspecified trimester: Secondary | ICD-10-CM

## 2017-02-18 LAB — URINALYSIS, ROUTINE W REFLEX MICROSCOPIC
BILIRUBIN URINE: NEGATIVE
Glucose, UA: NEGATIVE mg/dL
Hgb urine dipstick: NEGATIVE
Ketones, ur: NEGATIVE mg/dL
NITRITE: NEGATIVE
PH: 7 (ref 5.0–8.0)
Protein, ur: NEGATIVE mg/dL
SPECIFIC GRAVITY, URINE: 1.01 (ref 1.005–1.030)

## 2017-02-18 MED ORDER — COMFORT FIT MATERNITY SUPP MED MISC: 1.0000 | Freq: Every day | 0 refills | Status: DC

## 2017-02-18 NOTE — MAU Note (Signed)
Pt presents with c/o increased swelling in right hand and bilateral feet.  Pt denies H/A and visual disturbances.  Confimrs epigastric pain.  Denies VB or LOF.  Pt reports +FM, but decreased. Reports called office, instructed to be seen in MAU.

## 2017-02-18 NOTE — MAU Note (Signed)
Hardie ShackletonA. Sydny Schnitzler, RN assume care of patient.  EFM applied - FHR 140s. Positive for fetal movement, denies sudden gush of fluid, denies vaginal bleeding. Toco applied. Abd. Soft.

## 2017-02-18 NOTE — Discharge Instructions (Signed)
Carpal Tunnel Syndrome Carpal tunnel syndrome is a condition that causes pain in your hand and arm. The carpal tunnel is a narrow area that is on the palm side of your wrist. Repeated wrist motion or certain diseases may cause swelling in the tunnel. This swelling can pinch the main nerve in the wrist (median nerve). Follow these instructions at home: If you have a splint:  Wear it as told by your doctor. Remove it only as told by your doctor.  Loosen the splint if your fingers: ? Become numb and tingle. ? Turn blue and cold.  Keep the splint clean and dry. General instructions  Take over-the-counter and prescription medicines only as told by your doctor.  Rest your wrist from any activity that may be causing your pain. If needed, talk to your employer about changes that can be made in your work, such as getting a wrist pad to use while typing.  If directed, apply ice to the painful area: ? Put ice in a plastic bag. ? Place a towel between your skin and the bag. ? Leave the ice on for 20 minutes, 2-3 times per day.  Keep all follow-up visits as told by your doctor. This is important.  Do any exercises as told by your doctor, physical therapist, or occupational therapist. Contact a doctor if:  You have new symptoms.  Medicine does not help your pain.  Your symptoms get worse. This information is not intended to replace advice given to you by your health care provider. Make sure you discuss any questions you have with your health care provider. Document Released: 01/22/2011 Document Revised: 07/11/2015 Document Reviewed: 06/20/2014 Elsevier Interactive Patient Education  2018 Elsevier Inc.  

## 2017-02-18 NOTE — Telephone Encounter (Signed)
Returned call and pt stated that she has been having bad swelling in her hands and feet that is making it difficult for her to walk. Pt reports good fetal movement, denies headache, and spots. [facility]

## 2017-02-18 NOTE — MAU Provider Note (Signed)
History     CSN: [id number]  Arrival date and time: 02/18/17 1529   First Provider Initiated Contact with Patient 02/18/17 1748      Chief Complaint  Patient presents with  . Swelling   G1 @26 .4 wks here with extremity swelling. Sx have been ongoing but worse over the last 2 days. Sometimes it hurts to walk. 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 She also gets some swelling in her hands and at times has tingling and numbness in right hand. She denies HA, visual disturbances, and epigastric pain. Her pregnancy is complicated by excessive weight gain.    OB History    Gravida Para Term Preterm AB Living   1             SAB TAB Ectopic Multiple Live Births                  Past Medical History:  Diagnosis Date  . Medical history non-contributory   . Supervision of normal first pregnancy, antepartum 11/03/2016    Clinic [facility] Prenatal Labs Dating LMP Blood type:    Genetic Screen 1 Screen:    AFP:     Quad:     NIPS: Antibody:  Anatomic US  Rubella:   GTT Early:               Third trimester:  RPR:    Flu vaccine  HBsAg:    TDaP vaccine                                               Rhogam: HIV:    Baby Food Breast                                              GBS: (For PCN allergy, check sensitivities) Contraception Pill? Pap: Circumcision Yes if a boy  Pediatrician Undecided CF: Support Person [facility]-FO[facility] Susan-Mom [facility] Prenatal Classes Yes Hgb electrophoresis:      Past Surgical History:  Procedure Laterality Date  . birth mark removal  1997  . SPINAL CORD UNTETHERING  2006    Family History  Problem Relation Age of Onset  . Multiple sclerosis Mother   . Addison's disease Mother   . Graves' disease Mother   . Fibromyalgia Mother   . Bone cancer Maternal Grandmother   . Emphysema Maternal Grandfather     Social History   Tobacco Use  . Smoking status: Former Smoker    Last attempt to quit: 07/2016    Years since quitting: 0.5  . Smokeless tobacco: Never  Used  Substance Use Topics  . Alcohol use: No  . Drug use: No    Allergies: No Known Allergies  No medications prior to admission.    Review of Systems  Eyes: Negative for visual disturbance.  Cardiovascular: Positive for leg swelling.  Gastrointestinal: Negative for abdominal pain.  Neurological: Positive for numbness. Negative for headaches.   Physical Exam   Blood pressure 118/69, pulse 87, temperature 98.2 F (36.8 C), temperature source Oral, resp. rate 18, height 5\' 3"  (1.6 m), weight 177 lb 8 oz (80.5 kg), last menstrual period 08/19/2016, SpO2 98 %.  Patient Vitals for the past 24 hrs:  BP Temp Temp src Pulse Resp SpO2 Height Weight  02/18/17 1830 118/69 - - 87 - - - -  02/18/17 1755 126/79 - - 82 - - - -  02/18/17 1745 - - - - - 98 % - -  02/18/17 1740 - - - - - 98 % - -  02/18/17 1643 123/81 98.2 F (36.8 C) Oral 78 18 100 % 5\' 3"  (1.6 m) 177 lb 8 oz (80.5 kg)    Physical Exam  Nursing note and vitals reviewed. Constitutional: She is oriented to person, place, and time. She appears well-developed and well-nourished. No distress.  HENT:  Head: Normocephalic and atraumatic.  Neck: Normal range of motion.  Cardiovascular: Normal rate.  Respiratory: Effort normal. No respiratory distress.  GI: Soft. She exhibits no distension. There is no tenderness.  gravid  Musculoskeletal: Normal range of motion. She exhibits edema (trace to feet, trace to hands).  Neurological: She is alert and oriented to person, place, and time. She has normal reflexes. No cranial nerve deficit.  Skin: Skin is warm and dry.  Psychiatric: She has a normal mood and affect.  EFM: 140 bpm, mod variability, + accels, no decels Toco: none  Results for orders placed or performed during the hospital encounter of 02/18/17 (from the past 24 hour(s))  Urinalysis, Routine w reflex microscopic     Status: Abnormal   Collection Time: 02/18/17  4:46 PM  Result Value Ref Range   Color, Urine YELLOW  YELLOW   APPearance HAZY (A) CLEAR   Specific Gravity, Urine 1.010 1.005 - 1.030   pH 7.0 5.0 - 8.0   Glucose, UA NEGATIVE NEGATIVE mg/dL   Hgb urine dipstick NEGATIVE NEGATIVE   Bilirubin Urine NEGATIVE NEGATIVE   Ketones, ur NEGATIVE NEGATIVE mg/dL   Protein, ur NEGATIVE NEGATIVE mg/dL   Nitrite NEGATIVE NEGATIVE   Leukocytes, UA SMALL (A) NEGATIVE   RBC / HPF 0-5 0 - 5 RBC/hpf   WBC, UA 0-5 0 - 5 WBC/hpf   Bacteria, UA RARE (A) NONE SEEN   Squamous Epithelial / LPF 6-30 (A) NONE SEEN   Mucus PRESENT    MAU Course  Procedures  MDM Labs ordered and reviewed. Normal BPs, no evidence of pre-e. Trace edema to LE nml in pregnancy and likely has carpal tunnel developing in right hand. Discussed comfort measures- wrist split at hs, maternity belt, and maternity compressions. Stable for discharge home.   Assessment and Plan   1. [redacted] weeks gestation of pregnancy   2. Supervision of normal first pregnancy, antepartum   3. NST (non-stress test) reactive   4. Swelling of lower extremity during pregnancy in second trimester   5. Carpal tunnel syndrome during pregnancy    Discharge home Follow up in OB office as scheduled Rx maternity belt Rx maternity compression hose  Allergies as of 02/18/2017   No Known Allergies     Medication List    TAKE these medications   COMFORT FIT MATERNITY SUPP MED Misc 1 Device by Does not apply route daily.   pantoprazole 20 MG tablet Commonly known as:  PROTONIX Take 1 tablet (20 mg total) by mouth daily.   prenatal multivitamin Tabs tablet Take 1 tablet by mouth daily.      Donette Larry, CNM 02/18/2017, 8:00 PM

## 2017-03-02 ENCOUNTER — Ambulatory Visit (INDEPENDENT_AMBULATORY_CARE_PROVIDER_SITE_OTHER): Payer: Medicaid Other | Admitting: Obstetrics & Gynecology

## 2017-03-02 ENCOUNTER — Other Ambulatory Visit: Payer: Medicaid Other

## 2017-03-02 DIAGNOSIS — Z23 Encounter for immunization: Secondary | ICD-10-CM

## 2017-03-02 DIAGNOSIS — Z3400 Encounter for supervision of normal first pregnancy, unspecified trimester: Secondary | ICD-10-CM

## 2017-03-02 DIAGNOSIS — Z3403 Encounter for supervision of normal first pregnancy, third trimester: Secondary | ICD-10-CM

## 2017-03-02 NOTE — Patient Instructions (Signed)

## 2017-03-02 NOTE — Progress Notes (Signed)
Patient reports good fetal movement, denies pain. 

## 2017-03-02 NOTE — Progress Notes (Signed)
   PRENATAL VISIT NOTE  Subjective:  Allison Heath is a 22 y.o. G1P0 at 5510w2d being seen today for ongoing prenatal care.  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  Patient reports carpal tunnel symptoms.  Contractions: Not present. Vag. Bleeding: None.  Movement: Present. Denies leaking of fluid.   The following portions of the patient's history were reviewed and updated as appropriate: allergies, current medications, past family history, past medical history, past social history, past surgical history and problem list. Problem list updated.  Objective:   Vitals:   03/02/17 0842  BP: 118/81  Pulse: 89  Weight: 80.2 kg (176 lb 12.8 oz)    Fetal Status: Fetal Heart Rate (bpm): 145   Movement: Present     General:  Alert, oriented and cooperative. Patient is in no acute distress.  Skin: Skin is warm and dry. No rash noted.   Cardiovascular: Normal heart rate noted  Respiratory: Normal respiratory effort, no problems with respiration noted  Abdomen: Soft, gravid, appropriate for gestational age.  Pain/Pressure: Absent     Pelvic: Cervical exam deferred        Extremities: Normal range of motion.  Edema: Trace  Mental Status:  Normal mood and affect. Normal behavior. Normal judgment and thought content.   Assessment and Plan:  Pregnancy: G1P0 at 11010w2d  1. Supervision of normal first pregnancy, antepartum Routine testing 28 weeks - Glucose Tolerance, 2 Hours w/1 Hour - Obstetric Panel, Including HIV - RPR - CBC  Preterm labor symptoms and general obstetric precautions including but not limited to vaginal bleeding, contractions, leaking of fluid and fetal movement were reviewed in detail with the patient. Please refer to After Visit Summary for other counseling recommendations.  Return in about 2 weeks (around  03/16/2017). Will get wrist brace for CTS  Scheryl DarterJames Liesel Peckenpaugh, MD

## 2017-03-03 LAB — CBC
HEMATOCRIT: 33.5 % — AB (ref 34.0–46.6)
HEMOGLOBIN: 11.2 g/dL (ref 11.1–15.9)
MCH: 33.5 pg — AB (ref 26.6–33.0)
MCHC: 33.4 g/dL (ref 31.5–35.7)
MCV: 100 fL — ABNORMAL HIGH (ref 79–97)
Platelets: 209 10*3/uL (ref 150–379)
RBC: 3.34 x10E6/uL — AB (ref 3.77–5.28)
RDW: 13.5 % (ref 12.3–15.4)
WBC: 8.5 10*3/uL (ref 3.4–10.8)

## 2017-03-03 LAB — GLUCOSE TOLERANCE, 2 HOURS W/ 1HR
GLUCOSE, 2 HOUR: 105 mg/dL (ref 65–152)
Glucose, 1 hour: 102 mg/dL (ref 65–179)
Glucose, Fasting: 73 mg/dL (ref 65–91)

## 2017-03-03 LAB — RPR: RPR Ser Ql: NONREACTIVE

## 2017-03-03 LAB — HIV ANTIBODY (ROUTINE TESTING W REFLEX): HIV Screen 4th Generation wRfx: NONREACTIVE

## 2017-03-04 LAB — OBSTETRIC PANEL, INCLUDING HIV

## 2017-03-04 LAB — GLUCOSE TOLERANCE, 2 HOURS W/ 1HR

## 2017-03-16 ENCOUNTER — Encounter: Payer: Self-pay | Admitting: Obstetrics and Gynecology

## 2017-03-16 ENCOUNTER — Ambulatory Visit (INDEPENDENT_AMBULATORY_CARE_PROVIDER_SITE_OTHER): Payer: Medicaid Other | Admitting: Obstetrics and Gynecology

## 2017-03-16 DIAGNOSIS — Z3400 Encounter for supervision of normal first pregnancy, unspecified trimester: Secondary | ICD-10-CM

## 2017-03-16 DIAGNOSIS — Z3403 Encounter for supervision of normal first pregnancy, third trimester: Secondary | ICD-10-CM

## 2017-03-16 NOTE — Patient Instructions (Signed)
Third Trimester of Pregnancy The third trimester is from week 28 through week 40 (months 7 through 9). The third trimester is a time when the unborn baby (fetus) is growing rapidly. At the end of the ninth month, the fetus is about 20 inches in length and weighs 6-10 pounds. Body changes during your third trimester Your body will continue to go through many changes during pregnancy. The changes vary from woman to woman. During the third trimester:  Your weight will continue to increase. You can expect to gain 25-35 pounds (11-16 kg) by the end of the pregnancy.  You may begin to get stretch marks on your hips, abdomen, and breasts.  You may urinate more often because the fetus is moving lower into your pelvis and pressing on your bladder.  You may develop or continue to have heartburn. This is caused by increased hormones that slow down muscles in the digestive tract.  You may develop or continue to have constipation because increased hormones slow digestion and cause the muscles that push waste through your intestines to relax.  You may develop hemorrhoids. These are swollen veins (varicose veins) in the rectum that can itch or be painful.  You may develop swollen, bulging veins (varicose veins) in your legs.  You may have increased body aches in the pelvis, back, or thighs. This is due to weight gain and increased hormones that are relaxing your joints.  You may have changes in your hair. These can include thickening of your hair, rapid growth, and changes in texture. Some women also have hair loss during or after pregnancy, or hair that feels dry or thin. Your hair will most likely return to normal after your baby is born.  Your breasts will continue to grow and they will continue to become tender. A yellow fluid (colostrum) may leak from your breasts. This is the first milk you are producing for your baby.  Your belly button may stick out.  You may notice more swelling in your hands,  face, or ankles.  You may have increased tingling or numbness in your hands, arms, and legs. The skin on your belly may also feel numb.  You may feel short of breath because of your expanding uterus.  You may have more problems sleeping. This can be caused by the size of your belly, increased need to urinate, and an increase in your body's metabolism.  You may notice the fetus "dropping," or moving lower in your abdomen (lightening).  You may have increased vaginal discharge.  You may notice your joints feel loose and you may have pain around your pelvic bone.  What to expect at prenatal visits You will have prenatal exams every 2 weeks until week 36. Then you will have weekly prenatal exams. During a routine prenatal visit:  You will be weighed to make sure you and the baby are growing normally.  Your blood pressure will be taken.  Your abdomen will be measured to track your baby's growth.  The fetal heartbeat will be listened to.  Any test results from the previous visit will be discussed.  You may have a cervical check near your due date to see if your cervix has softened or thinned (effaced).  You will be tested for Group B streptococcus. This happens between 35 and 37 weeks.  Your health care provider may ask you:  What your birth plan is.  How you are feeling.  If you are feeling the baby move.  If you have had   any abnormal symptoms, such as leaking fluid, bleeding, severe headaches, or abdominal cramping.  If you are using any tobacco products, including cigarettes, chewing tobacco, and electronic cigarettes.  If you have any questions.  Other tests or screenings that may be performed during your third trimester include:  Blood tests that check for low iron levels (anemia).  Fetal testing to check the health, activity level, and growth of the fetus. Testing is done if you have certain medical conditions or if there are problems during the  pregnancy.  Nonstress test (NST). This test checks the health of your baby to make sure there are no signs of problems, such as the baby not getting enough oxygen. During this test, a belt is placed around your belly. The baby is made to move, and its heart rate is monitored during movement.  What is false labor? False labor is a condition in which you feel small, irregular tightenings of the muscles in the womb (contractions) that usually go away with rest, changing position, or drinking water. These are called Braxton Hicks contractions. Contractions may last for hours, days, or even weeks before true labor sets in. If contractions come at regular intervals, become more frequent, increase in intensity, or become painful, you should see your health care provider. What are the signs of labor?  Abdominal cramps.  Regular contractions that start at 10 minutes apart and become stronger and more frequent with time.  Contractions that start on the top of the uterus and spread down to the lower abdomen and back.  Increased pelvic pressure and dull back pain.  A watery or bloody mucus discharge that comes from the vagina.  Leaking of amniotic fluid. This is also known as your "water breaking." It could be a slow trickle or a gush. Let your health care provider know if it has a color or strange odor. If you have any of these signs, call your health care provider right away, even if it is before your due date. Follow these instructions at home: Medicines  Follow your health care provider's instructions regarding medicine use. Specific medicines may be either safe or unsafe to take during pregnancy.  Take a prenatal vitamin that contains at least 600 micrograms (mcg) of folic acid.  If you develop constipation, try taking a stool softener if your health care provider approves. Eating and drinking  Eat a balanced diet that includes fresh fruits and vegetables, whole grains, good sources of protein  such as meat, eggs, or tofu, and low-fat dairy. Your health care provider will help you determine the amount of weight gain that is right for you.  Avoid raw meat and uncooked cheese. These carry germs that can cause birth defects in the baby.  If you have low calcium intake from food, talk to your health care provider about whether you should take a daily calcium supplement.  Eat four or five small meals rather than three large meals a day.  Limit foods that are high in fat and processed sugars, such as fried and sweet foods.  To prevent constipation: ? Drink enough fluid to keep your urine clear or pale yellow. ? Eat foods that are high in fiber, such as fresh fruits and vegetables, whole grains, and beans. Activity  Exercise only as directed by your health care provider. Most women can continue their usual exercise routine during pregnancy. Try to exercise for 30 minutes at least 5 days a week. Stop exercising if you experience uterine contractions.  Avoid heavy   lifting.  Do not exercise in extreme heat or humidity, or at high altitudes.  Wear low-heel, comfortable shoes.  Practice good posture.  You may continue to have sex unless your health care provider tells you otherwise. Relieving pain and discomfort  Take frequent breaks and rest with your legs elevated if you have leg cramps or low back pain.  Take warm sitz baths to soothe any pain or discomfort caused by hemorrhoids. Use hemorrhoid cream if your health care provider approves.  Wear a good support bra to prevent discomfort from breast tenderness.  If you develop varicose veins: ? Wear support pantyhose or compression stockings as told by your healthcare provider. ? Elevate your feet for 15 minutes, 3-4 times a day. Prenatal care  Write down your questions. Take them to your prenatal visits.  Keep all your prenatal visits as told by your health care provider. This is important. Safety  Wear your seat belt at  all times when driving.  Make a list of emergency phone numbers, including numbers for family, friends, the hospital, and police and fire departments. General instructions  Avoid cat litter boxes and soil used by cats. These carry germs that can cause birth defects in the baby. If you have a cat, ask someone to clean the litter box for you.  Do not travel far distances unless it is absolutely necessary and only with the approval of your health care provider.  Do not use hot tubs, steam rooms, or saunas.  Do not drink alcohol.  Do not use any products that contain nicotine or tobacco, such as cigarettes and e-cigarettes. If you need help quitting, ask your health care provider.  Do not use any medicinal herbs or unprescribed drugs. These chemicals affect the formation and growth of the baby.  Do not douche or use tampons or scented sanitary pads.  Do not cross your legs for long periods of time.  To prepare for the arrival of your baby: ? Take prenatal classes to understand, practice, and ask questions about labor and delivery. ? Make a trial run to the hospital. ? Visit the hospital and tour the maternity area. ? Arrange for maternity or paternity leave through employers. ? Arrange for family and friends to take care of pets while you are in the hospital. ? Purchase a rear-facing car seat and make sure you know how to install it in your car. ? Pack your hospital bag. ? Prepare the baby's nursery. Make sure to remove all pillows and stuffed animals from the baby's crib to prevent suffocation.  Visit your dentist if you have not gone during your pregnancy. Use a soft toothbrush to brush your teeth and be gentle when you floss. Contact a health care provider if:  You are unsure if you are in labor or if your water has broken.  You become dizzy.  You have mild pelvic cramps, pelvic pressure, or nagging pain in your abdominal area.  You have lower back pain.  You have persistent  nausea, vomiting, or diarrhea.  You have an unusual or bad smelling vaginal discharge.  You have pain when you urinate. Get help right away if:  Your water breaks before 37 weeks.  You have regular contractions less than 5 minutes apart before 37 weeks.  You have a fever.  You are leaking fluid from your vagina.  You have spotting or bleeding from your vagina.  You have severe abdominal pain or cramping.  You have rapid weight loss or weight gain.    You have shortness of breath with chest pain.  You notice sudden or extreme swelling of your face, hands, ankles, feet, or legs.  Your baby makes fewer than 10 movements in 2 hours.  You have severe headaches that do not go away when you take medicine.  You have vision changes. Summary  The third trimester is from week 28 through week 40, months 7 through 9. The third trimester is a time when the unborn baby (fetus) is growing rapidly.  During the third trimester, your discomfort may increase as you and your baby continue to gain weight. You may have abdominal, leg, and back pain, sleeping problems, and an increased need to urinate.  During the third trimester your breasts will keep growing and they will continue to become tender. A yellow fluid (colostrum) may leak from your breasts. This is the first milk you are producing for your baby.  False labor is a condition in which you feel small, irregular tightenings of the muscles in the womb (contractions) that eventually go away. These are called Braxton Hicks contractions. Contractions may last for hours, days, or even weeks before true labor sets in.  Signs of labor can include: abdominal cramps; regular contractions that start at 10 minutes apart and become stronger and more frequent with time; watery or bloody mucus discharge that comes from the vagina; increased pelvic pressure and dull back pain; and leaking of amniotic fluid. This information is not intended to replace advice  given to you by your health care provider. Make sure you discuss any questions you have with your health care provider. Document Released: 01/27/2001 Document Revised: 07/11/2015 Document Reviewed: 04/05/2012 Elsevier Interactive Patient Education  2017 [employer]  

## 2017-03-16 NOTE — Progress Notes (Signed)
Subjective:  Allison Heath is a 22 y.o. G1P0 at 7558w2d being seen today for ongoing prenatal care.  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  Patient reports no complaints.  Contractions: Not present. Vag. Bleeding: None.  Movement: Present. Denies leaking of fluid.   The following portions of the patient's history were reviewed and updated as appropriate: allergies, current medications, past family history, past medical history, past social history, past surgical history and problem list. Problem list updated.  Objective:   Vitals:   03/16/17 1006  BP: 127/88  Pulse: (!) 102  Weight: 181 lb 8 oz (82.3 kg)    Fetal Status: Fetal Heart Rate (bpm): 142    Movement: Present     General:  Alert, oriented and cooperative. Patient is in no acute distress.  Skin: Skin is warm and dry. No rash noted.   Cardiovascular: Normal heart rate noted  Respiratory: Normal respiratory effort, no problems with respiration noted  Abdomen: Soft, gravid, appropriate for gestational age. Pain/Pressure: Present     Pelvic:  Cervical exam deferred        Extremities: Normal range of motion.  Edema: Trace  Mental Status: Normal mood and affect. Normal behavior. Normal judgment and thought content.   Urinalysis:      Assessment and Plan:  Pregnancy: G1P0 at 7358w2d  1. Supervision of normal first pregnancy, antepartum Stable  Preterm labor symptoms and general obstetric precautions including but not limited to vaginal bleeding, contractions, leaking of fluid and fetal movement were reviewed in detail with the patient. Please refer to After Visit Summary for other counseling recommendations.  Return in about 2 weeks (around 03/30/2017) for OB visit.   Hermina StaggersErvin, Tevis Conger L, MD

## 2017-03-30 ENCOUNTER — Encounter: Payer: Self-pay | Admitting: Obstetrics and Gynecology

## 2017-03-30 ENCOUNTER — Ambulatory Visit (INDEPENDENT_AMBULATORY_CARE_PROVIDER_SITE_OTHER): Payer: Medicaid Other | Admitting: Obstetrics and Gynecology

## 2017-03-30 VITALS — BP 123/83 | HR 87 | Wt 185.0 lb

## 2017-03-30 DIAGNOSIS — Z3400 Encounter for supervision of normal first pregnancy, unspecified trimester: Secondary | ICD-10-CM

## 2017-03-30 NOTE — Progress Notes (Signed)
Subjective:  Allison Heath is a 22 y.o. G1P0 at 2580w2d being seen today for ongoing prenatal care.  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  Patient reports no complaints.  Contractions: Not present. Vag. Bleeding: None.  Movement: Present. Denies leaking of fluid.   The following portions of the patient's history were reviewed and updated as appropriate: allergies, current medications, past family history, past medical history, past social history, past surgical history and problem list. Problem list updated.  Objective:   Vitals:   03/30/17 1307  BP: 123/83  Pulse: 87  Weight: 185 lb (83.9 kg)    Fetal Status: Fetal Heart Rate (bpm): 145   Movement: Present     General:  Alert, oriented and cooperative. Patient is in no acute distress.  Skin: Skin is warm and dry. No rash noted.   Cardiovascular: Normal heart rate noted  Respiratory: Normal respiratory effort, no problems with respiration noted  Abdomen: Soft, gravid, appropriate for gestational age. Pain/Pressure: Absent     Pelvic:  Cervical exam deferred        Extremities: Normal range of motion.     Mental Status: Normal mood and affect. Normal behavior. Normal judgment and thought content.   Urinalysis:      Assessment and Plan:  Pregnancy: G1P0 at 7180w2d  1. Supervision of normal first pregnancy, antepartum Stable  Preterm labor symptoms and general obstetric precautions including but not limited to vaginal bleeding, contractions, leaking of fluid and fetal movement were reviewed in detail with the patient. Please refer to After Visit Summary for other counseling recommendations.  Return in about 2 weeks (around 04/13/2017) for OB visit.   Hermina StaggersErvin, Michael L, MD

## 2017-03-30 NOTE — Patient Instructions (Signed)
Third Trimester of Pregnancy The third trimester is from week 28 through week 40 (months 7 through 9). The third trimester is a time when the unborn baby (fetus) is growing rapidly. At the end of the ninth month, the fetus is about 20 inches in length and weighs 6-10 pounds. Body changes during your third trimester Your body will continue to go through many changes during pregnancy. The changes vary from woman to woman. During the third trimester:  Your weight will continue to increase. You can expect to gain 25-35 pounds (11-16 kg) by the end of the pregnancy.  You may begin to get stretch marks on your hips, abdomen, and breasts.  You may urinate more often because the fetus is moving lower into your pelvis and pressing on your bladder.  You may develop or continue to have heartburn. This is caused by increased hormones that slow down muscles in the digestive tract.  You may develop or continue to have constipation because increased hormones slow digestion and cause the muscles that push waste through your intestines to relax.  You may develop hemorrhoids. These are swollen veins (varicose veins) in the rectum that can itch or be painful.  You may develop swollen, bulging veins (varicose veins) in your legs.  You may have increased body aches in the pelvis, back, or thighs. This is due to weight gain and increased hormones that are relaxing your joints.  You may have changes in your hair. These can include thickening of your hair, rapid growth, and changes in texture. Some women also have hair loss during or after pregnancy, or hair that feels dry or thin. Your hair will most likely return to normal after your baby is born.  Your breasts will continue to grow and they will continue to become tender. A yellow fluid (colostrum) may leak from your breasts. This is the first milk you are producing for your baby.  Your belly button may stick out.  You may notice more swelling in your hands,  face, or ankles.  You may have increased tingling or numbness in your hands, arms, and legs. The skin on your belly may also feel numb.  You may feel short of breath because of your expanding uterus.  You may have more problems sleeping. This can be caused by the size of your belly, increased need to urinate, and an increase in your body's metabolism.  You may notice the fetus "dropping," or moving lower in your abdomen (lightening).  You may have increased vaginal discharge.  You may notice your joints feel loose and you may have pain around your pelvic bone.  What to expect at prenatal visits You will have prenatal exams every 2 weeks until week 36. Then you will have weekly prenatal exams. During a routine prenatal visit:  You will be weighed to make sure you and the baby are growing normally.  Your blood pressure will be taken.  Your abdomen will be measured to track your baby's growth.  The fetal heartbeat will be listened to.  Any test results from the previous visit will be discussed.  You may have a cervical check near your due date to see if your cervix has softened or thinned (effaced).  You will be tested for Group B streptococcus. This happens between 35 and 37 weeks.  Your health care provider may ask you:  What your birth plan is.  How you are feeling.  If you are feeling the baby move.  If you have had   any abnormal symptoms, such as leaking fluid, bleeding, severe headaches, or abdominal cramping.  If you are using any tobacco products, including cigarettes, chewing tobacco, and electronic cigarettes.  If you have any questions.  Other tests or screenings that may be performed during your third trimester include:  Blood tests that check for low iron levels (anemia).  Fetal testing to check the health, activity level, and growth of the fetus. Testing is done if you have certain medical conditions or if there are problems during the  pregnancy.  Nonstress test (NST). This test checks the health of your baby to make sure there are no signs of problems, such as the baby not getting enough oxygen. During this test, a belt is placed around your belly. The baby is made to move, and its heart rate is monitored during movement.  What is false labor? False labor is a condition in which you feel small, irregular tightenings of the muscles in the womb (contractions) that usually go away with rest, changing position, or drinking water. These are called Braxton Hicks contractions. Contractions may last for hours, days, or even weeks before true labor sets in. If contractions come at regular intervals, become more frequent, increase in intensity, or become painful, you should see your health care provider. What are the signs of labor?  Abdominal cramps.  Regular contractions that start at 10 minutes apart and become stronger and more frequent with time.  Contractions that start on the top of the uterus and spread down to the lower abdomen and back.  Increased pelvic pressure and dull back pain.  A watery or bloody mucus discharge that comes from the vagina.  Leaking of amniotic fluid. This is also known as your "water breaking." It could be a slow trickle or a gush. Let your health care provider know if it has a color or strange odor. If you have any of these signs, call your health care provider right away, even if it is before your due date. Follow these instructions at home: Medicines  Follow your health care provider's instructions regarding medicine use. Specific medicines may be either safe or unsafe to take during pregnancy.  Take a prenatal vitamin that contains at least 600 micrograms (mcg) of folic acid.  If you develop constipation, try taking a stool softener if your health care provider approves. Eating and drinking  Eat a balanced diet that includes fresh fruits and vegetables, whole grains, good sources of protein  such as meat, eggs, or tofu, and low-fat dairy. Your health care provider will help you determine the amount of weight gain that is right for you.  Avoid raw meat and uncooked cheese. These carry germs that can cause birth defects in the baby.  If you have low calcium intake from food, talk to your health care provider about whether you should take a daily calcium supplement.  Eat four or five small meals rather than three large meals a day.  Limit foods that are high in fat and processed sugars, such as fried and sweet foods.  To prevent constipation: ? Drink enough fluid to keep your urine clear or pale yellow. ? Eat foods that are high in fiber, such as fresh fruits and vegetables, whole grains, and beans. Activity  Exercise only as directed by your health care provider. Most women can continue their usual exercise routine during pregnancy. Try to exercise for 30 minutes at least 5 days a week. Stop exercising if you experience uterine contractions.  Avoid heavy   lifting.  Do not exercise in extreme heat or humidity, or at high altitudes.  Wear low-heel, comfortable shoes.  Practice good posture.  You may continue to have sex unless your health care provider tells you otherwise. Relieving pain and discomfort  Take frequent breaks and rest with your legs elevated if you have leg cramps or low back pain.  Take warm sitz baths to soothe any pain or discomfort caused by hemorrhoids. Use hemorrhoid cream if your health care provider approves.  Wear a good support bra to prevent discomfort from breast tenderness.  If you develop varicose veins: ? Wear support pantyhose or compression stockings as told by your healthcare provider. ? Elevate your feet for 15 minutes, 3-4 times a day. Prenatal care  Write down your questions. Take them to your prenatal visits.  Keep all your prenatal visits as told by your health care provider. This is important. Safety  Wear your seat belt at  all times when driving.  Make a list of emergency phone numbers, including numbers for family, friends, the hospital, and police and fire departments. General instructions  Avoid cat litter boxes and soil used by cats. These carry germs that can cause birth defects in the baby. If you have a cat, ask someone to clean the litter box for you.  Do not travel far distances unless it is absolutely necessary and only with the approval of your health care provider.  Do not use hot tubs, steam rooms, or saunas.  Do not drink alcohol.  Do not use any products that contain nicotine or tobacco, such as cigarettes and e-cigarettes. If you need help quitting, ask your health care provider.  Do not use any medicinal herbs or unprescribed drugs. These chemicals affect the formation and growth of the baby.  Do not douche or use tampons or scented sanitary pads.  Do not cross your legs for long periods of time.  To prepare for the arrival of your baby: ? Take prenatal classes to understand, practice, and ask questions about labor and delivery. ? Make a trial run to the hospital. ? Visit the hospital and tour the maternity area. ? Arrange for maternity or paternity leave through employers. ? Arrange for family and friends to take care of pets while you are in the hospital. ? Purchase a rear-facing car seat and make sure you know how to install it in your car. ? Pack your hospital bag. ? Prepare the baby's nursery. Make sure to remove all pillows and stuffed animals from the baby's crib to prevent suffocation.  Visit your dentist if you have not gone during your pregnancy. Use a soft toothbrush to brush your teeth and be gentle when you floss. Contact a health care provider if:  You are unsure if you are in labor or if your water has broken.  You become dizzy.  You have mild pelvic cramps, pelvic pressure, or nagging pain in your abdominal area.  You have lower back pain.  You have persistent  nausea, vomiting, or diarrhea.  You have an unusual or bad smelling vaginal discharge.  You have pain when you urinate. Get help right away if:  Your water breaks before 37 weeks.  You have regular contractions less than 5 minutes apart before 37 weeks.  You have a fever.  You are leaking fluid from your vagina.  You have spotting or bleeding from your vagina.  You have severe abdominal pain or cramping.  You have rapid weight loss or weight gain.    You have shortness of breath with chest pain.  You notice sudden or extreme swelling of your face, hands, ankles, feet, or legs.  Your baby makes fewer than 10 movements in 2 hours.  You have severe headaches that do not go away when you take medicine.  You have vision changes. Summary  The third trimester is from week 28 through week 40, months 7 through 9. The third trimester is a time when the unborn baby (fetus) is growing rapidly.  During the third trimester, your discomfort may increase as you and your baby continue to gain weight. You may have abdominal, leg, and back pain, sleeping problems, and an increased need to urinate.  During the third trimester your breasts will keep growing and they will continue to become tender. A yellow fluid (colostrum) may leak from your breasts. This is the first milk you are producing for your baby.  False labor is a condition in which you feel small, irregular tightenings of the muscles in the womb (contractions) that eventually go away. These are called Braxton Hicks contractions. Contractions may last for hours, days, or even weeks before true labor sets in.  Signs of labor can include: abdominal cramps; regular contractions that start at 10 minutes apart and become stronger and more frequent with time; watery or bloody mucus discharge that comes from the vagina; increased pelvic pressure and dull back pain; and leaking of amniotic fluid. This information is not intended to replace advice  given to you by your health care provider. Make sure you discuss any questions you have with your health care provider. Document Released: 01/27/2001 Document Revised: 07/11/2015 Document Reviewed: 04/05/2012 Elsevier Interactive Patient Education  2017 [employer]  

## 2017-04-13 ENCOUNTER — Encounter: Payer: Self-pay | Admitting: Obstetrics & Gynecology

## 2017-04-13 ENCOUNTER — Ambulatory Visit (INDEPENDENT_AMBULATORY_CARE_PROVIDER_SITE_OTHER): Payer: Medicaid Other | Admitting: Obstetrics & Gynecology

## 2017-04-13 DIAGNOSIS — Z3400 Encounter for supervision of normal first pregnancy, unspecified trimester: Secondary | ICD-10-CM

## 2017-04-13 NOTE — Patient Instructions (Addendum)
Return to clinic for any scheduled appointments or obstetric concerns, or go to [facility] for evaluation  Thank you for enrolling in MyChart. Please follow the instructions below to securely access your online medical record. MyChart allows you to send messages to your doctor, view your test results, manage appointments, and more.   How Do I Sign Up? 1. In your Internet browser, go to [employer] Address [employer] and enter [email or web address]. 2. Click on the Sign Up Now link in the Sign In box. You will see the New Member Sign Up page. 3. Enter your MyChart Access Code exactly as it appears below. You will not need to use this code after you've completed the sign-up process. If you do not sign up before the expiration date, you must request a new code.  MyChart Access Code: [email or web address] Expires: 05/28/2017 10:40 AM  4. Enter your Social Security Number ([id number]) and Date of Birth (mm/dd/yyyy) as indicated and click Submit. You will be taken to the next sign-up page. 5. Create a MyChart ID. This will be your MyChart login ID and cannot be changed, so think of one that is secure and easy to remember. 6. Create a MyChart password. You can change your password at any time. 7. Enter your Password Reset Question and Answer. This can be used at a later time if you forget your password.  8. Enter your e-mail address. You will receive e-mail notification when new information is available in MyChart. 9. Click Sign Up. You can now view your medical record.   Additional Information Remember, MyChart is NOT to be used for urgent needs. For medical emergencies, dial 911.

## 2017-04-13 NOTE — Progress Notes (Signed)
   PRENATAL VISIT NOTE  Subjective:  Allison Heath is a 22 y.o. G1P0 at 9928w2d being seen today for ongoing prenatal care.  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  Patient reports no complaints.  Contractions: Irregular. Vag. Bleeding: None.  Movement: Present. Denies leaking of fluid.   The following portions of the patient's history were reviewed and updated as appropriate: allergies, current medications, past family history, past medical history, past social history, past surgical history and problem list. Problem list updated.  Objective:   Vitals:   04/13/17 1002  BP: 110/74  Pulse: 94  Weight: 192 lb (87.1 kg)    Fetal Status: Fetal Heart Rate (bpm): 152 Fundal Height: 34 cm Movement: Present     General:  Alert, oriented and cooperative. Patient is in no acute distress.  Skin: Skin is warm and dry. No rash noted.   Cardiovascular: Normal heart rate noted  Respiratory: Normal respiratory effort, no problems with respiration noted  Abdomen: Soft, gravid, appropriate for gestational age.  Pain/Pressure: Absent     Pelvic: Cervical exam deferred        Extremities: Normal range of motion.  Edema: Trace  Mental Status:  Normal mood and affect. Normal behavior. Normal judgment and thought content.   Assessment and Plan:  Pregnancy: G1P0 at 728w2d  1. Supervision of normal first pregnancy, antepartum Preterm labor symptoms and general obstetric precautions including but not limited to vaginal bleeding, contractions, leaking of fluid and fetal movement were reviewed in detail with the patient. Please refer to After Visit Summary for other counseling recommendations.  Return in about 2 weeks (around 04/27/2017) for OB Visit, Pelvic cultures.   Jaynie CollinsUgonna Kimimila Tauzin, MD

## 2017-04-27 ENCOUNTER — Ambulatory Visit (INDEPENDENT_AMBULATORY_CARE_PROVIDER_SITE_OTHER): Payer: Medicaid Other | Admitting: Obstetrics & Gynecology

## 2017-04-27 ENCOUNTER — Encounter: Payer: Self-pay | Admitting: Obstetrics & Gynecology

## 2017-04-27 ENCOUNTER — Other Ambulatory Visit (HOSPITAL_COMMUNITY)
Admission: RE | Admit: 2017-04-27 | Discharge: 2017-04-27 | Disposition: A | Discharge status: Home / Self Care | Payer: Medicaid Other | Source: Ambulatory Visit | Attending: Obstetrics & Gynecology | Admitting: Obstetrics & Gynecology

## 2017-04-27 ENCOUNTER — Telehealth: Payer: Self-pay | Admitting: *Deleted

## 2017-04-27 DIAGNOSIS — Z3A36 36 weeks gestation of pregnancy: Secondary | ICD-10-CM | POA: Diagnosis not present

## 2017-04-27 DIAGNOSIS — Z3400 Encounter for supervision of normal first pregnancy, unspecified trimester: Secondary | ICD-10-CM

## 2017-04-27 DIAGNOSIS — Z3403 Encounter for supervision of normal first pregnancy, third trimester: Secondary | ICD-10-CM | POA: Diagnosis present

## 2017-04-27 LAB — OB RESULTS CONSOLE GC/CHLAMYDIA: GC PROBE AMP, GENITAL: NEGATIVE

## 2017-04-27 NOTE — Progress Notes (Signed)
Pt c/o painful ctx's last night but she didn't time them.

## 2017-04-27 NOTE — Progress Notes (Signed)
   PRENATAL VISIT NOTE  Subjective:  Allison Heath is a 22 y.o. G1P0 at 3689w2d being seen today for ongoing prenatal care.  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  Patient reports occasional contractions.  Contractions: Irregular. Vag. Bleeding: None.  Movement: Present. Denies leaking of fluid.   The following portions of the patient's history were reviewed and updated as appropriate: allergies, current medications, past family history, past medical history, past social history, past surgical history and problem list. Problem list updated.  Objective:   Vitals:   04/27/17 1108  BP: 126/78  Pulse: (!) 103  Weight: 197 lb 3.2 oz (89.4 kg)    Fetal Status:   Fundal Height: 36 cm Movement: Present  Presentation: Vertex  General:  Alert, oriented and cooperative. Patient is in no acute distress.  Skin: Skin is warm and dry. No rash noted.   Cardiovascular: Normal heart rate noted  Respiratory: Normal respiratory effort, no problems with respiration noted  Abdomen: Soft, gravid, appropriate for gestational age.  Pain/Pressure: Present     Pelvic: Cervical exam performed Dilation: Closed Effacement (%): 30    Extremities: Normal range of motion.  Edema: Trace  Mental Status:  Normal mood and affect. Normal behavior. Normal judgment and thought content.   Assessment and Plan:  Pregnancy: G1P0 at 7389w2d  1. Supervision of normal first pregnancy, antepartum Routine 36 weeks - Strep Gp B NAA - Cervicovaginal ancillary only  Preterm labor symptoms and general obstetric precautions including but not limited to vaginal bleeding, contractions, leaking of fluid and fetal movement were reviewed in detail with the patient. Please refer to After Visit Summary for other counseling recommendations.  Return in about 1 week (around  05/04/2017).   Scheryl DarterJames Arnold, MD

## 2017-04-27 NOTE — Telephone Encounter (Signed)
Pt called to office stating that she was checked in office and is now having some bleeding more than spotting.  Pt advised that she could be seen in [facility] for eval if increase in bleeding continues.  Pt states not as much now, but was concerned how it looked- was "thicker" than spotting. Pt states no other concerns/changes. Pt again advised to be seen in [facility] if bleeding continues.  Pt states understanding.

## 2017-04-28 LAB — CERVICOVAGINAL ANCILLARY ONLY
Bacterial vaginitis: POSITIVE — AB
Candida vaginitis: NEGATIVE
Chlamydia: NEGATIVE
Neisseria Gonorrhea: NEGATIVE
Trichomonas: NEGATIVE

## 2017-04-29 ENCOUNTER — Telehealth: Payer: Self-pay | Admitting: Pediatrics

## 2017-04-29 ENCOUNTER — Other Ambulatory Visit (HOSPITAL_BASED_OUTPATIENT_CLINIC_OR_DEPARTMENT_OTHER): Payer: Self-pay | Admitting: Obstetrics & Gynecology

## 2017-04-29 DIAGNOSIS — N760 Acute vaginitis: Principal | ICD-10-CM

## 2017-04-29 DIAGNOSIS — B9689 Other specified bacterial agents as the cause of diseases classified elsewhere: Secondary | ICD-10-CM

## 2017-04-29 LAB — STREP GP B NAA: STREP GROUP B AG: NEGATIVE

## 2017-04-29 MED ORDER — METRONIDAZOLE 500 MG PO TABS: 500.0000 mg | ORAL_TABLET | Freq: Two times a day (BID) | ORAL | 0 refills | Status: DC

## 2017-04-29 NOTE — Telephone Encounter (Signed)
Pt reports seeing +BVag on MyChart, is out of town, and would [place] [facility] [place]. I advised I would do so.  Metronidazole sent per standing order.

## 2017-05-04 ENCOUNTER — Encounter: Payer: Self-pay | Admitting: Obstetrics and Gynecology

## 2017-05-04 ENCOUNTER — Ambulatory Visit (INDEPENDENT_AMBULATORY_CARE_PROVIDER_SITE_OTHER): Payer: Medicaid Other | Admitting: Obstetrics and Gynecology

## 2017-05-04 VITALS — BP 128/80 | HR 83 | Wt 201.7 lb

## 2017-05-04 DIAGNOSIS — Z3403 Encounter for supervision of normal first pregnancy, third trimester: Secondary | ICD-10-CM

## 2017-05-04 DIAGNOSIS — Z3400 Encounter for supervision of normal first pregnancy, unspecified trimester: Secondary | ICD-10-CM

## 2017-05-04 NOTE — Progress Notes (Signed)
C/o pressure and swelling in hands and feet.

## 2017-05-04 NOTE — Patient Instructions (Signed)
Etonogestrel implant What is this medicine? ETONOGESTREL (et oh noe JES trel) is a contraceptive (birth control) device. It is used to prevent pregnancy. It can be used for up to 3 years. This medicine may be used for other purposes; ask your health care provider or pharmacist if you have questions. COMMON BRAND NAME(S): Implanon, Nexplanon What should I tell my health care provider before I take this medicine? They need to know if you have any of these conditions: -abnormal vaginal bleeding -blood vessel disease or blood clots -cancer of the breast, cervix, or liver -depression -diabetes -gallbladder disease -headaches -heart disease or recent heart attack -high blood pressure -high cholesterol -kidney disease -liver disease -renal disease -seizures -tobacco smoker -an unusual or allergic reaction to etonogestrel, other hormones, anesthetics or antiseptics, medicines, foods, dyes, or preservatives -pregnant or trying to get pregnant -breast-feeding How should I use this medicine? This device is inserted just under the skin on the inner side of your upper arm by a health care professional. Talk to your pediatrician regarding the use of this medicine in children. Special care may be needed. Overdosage: If you think you have taken too much of this medicine contact a poison control center or emergency room at once. NOTE: This medicine is only for you. Do not share this medicine with others. What if I miss a dose? This does not apply. What may interact with this medicine? Do not take this medicine with any of the following medications: -amprenavir -bosentan -fosamprenavir This medicine may also interact with the following medications: -barbiturate medicines for inducing sleep or treating seizures -certain medicines for fungal infections like ketoconazole and itraconazole -grapefruit juice -griseofulvin -medicines to treat seizures like carbamazepine, felbamate, oxcarbazepine,  phenytoin, topiramate -modafinil -phenylbutazone -rifampin -rufinamide -some medicines to treat HIV infection like atazanavir, indinavir, lopinavir, nelfinavir, tipranavir, ritonavir -St. John's wort This list may not describe all possible interactions. Give your health care provider a list of all the medicines, herbs, non-prescription drugs, or dietary supplements you use. Also tell them if you smoke, drink alcohol, or use illegal drugs. Some items may interact with your medicine. What should I watch for while using this medicine? This product does not protect you against HIV infection (AIDS) or other sexually transmitted diseases. You should be able to feel the implant by pressing your fingertips over the skin where it was inserted. Contact your doctor if you cannot feel the implant, and use a non-hormonal birth control method (such as condoms) until your doctor confirms that the implant is in place. If you feel that the implant may have broken or become bent while in your arm, contact your healthcare provider. What side effects may I notice from receiving this medicine? Side effects that you should report to your doctor or health care professional as soon as possible: -allergic reactions like skin rash, itching or hives, swelling of the face, lips, or tongue -breast lumps -changes in emotions or moods -depressed mood -heavy or prolonged menstrual bleeding -pain, irritation, swelling, or bruising at the insertion site -scar at site of insertion -signs of infection at the insertion site such as fever, and skin redness, pain or discharge -signs of pregnancy -signs and symptoms of a blood clot such as breathing problems; changes in vision; chest pain; severe, sudden headache; pain, swelling, warmth in the leg; trouble speaking; sudden numbness or weakness of the face, arm or leg -signs and symptoms of liver injury like dark yellow or brown urine; general ill feeling or flu-like symptoms;  light-colored   stools; loss of appetite; nausea; right upper belly pain; unusually weak or tired; yellowing of the eyes or skin -unusual vaginal bleeding, discharge -signs and symptoms of a stroke like changes in vision; confusion; trouble speaking or understanding; severe headaches; sudden numbness or weakness of the face, arm or leg; trouble walking; dizziness; loss of balance or coordination Side effects that usually do not require medical attention (report to your doctor or health care professional if they continue or are bothersome): -acne -back pain -breast pain -changes in weight -dizziness -general ill feeling or flu-like symptoms -headache -irregular menstrual bleeding -nausea -sore throat -vaginal irritation or inflammation This list may not describe all possible side effects. Call your doctor for medical advice about side effects. You may report side effects to FDA at 1-800-FDA-1088. Where should I keep my medicine? This drug is given in a hospital or clinic and will not be stored at home. NOTE: This sheet is a summary. It may not cover all possible information. If you have questions about this medicine, talk to your doctor, pharmacist, or health care provider.  2018 Elsevier/Gold Standard (2015-08-22 11:19:22) Intrauterine Device Information An intrauterine device (IUD) is inserted into your uterus to prevent pregnancy. There are two types of IUDs available:  Copper IUD-This type of IUD is wrapped in copper wire and is placed inside the uterus. Copper makes the uterus and fallopian tubes produce a fluid that kills sperm. The copper IUD can stay in place for 10 years.  Hormone IUD-This type of IUD contains the hormone progestin (synthetic progesterone). The hormone thickens the cervical mucus and prevents sperm from entering the uterus. It also thins the uterine lining to prevent implantation of a fertilized egg. The hormone can weaken or kill the sperm that get into the uterus.  One type of hormone IUD can stay in place for 5 years, and another type can stay in place for 3 years.  Your health care provider will make sure you are a good candidate for a contraceptive IUD. Discuss with your health care provider the possible side effects. Advantages of an intrauterine device  IUDs are highly effective, reversible, long acting, and low maintenance.  There are no estrogen-related side effects.  An IUD can be used when breastfeeding.  IUDs are not associated with weight gain.  The copper IUD works immediately after insertion.  The hormone IUD works right away if inserted within 7 days of your period starting. You will need to use a backup method of birth control for 7 days if the hormone IUD is inserted at any other time in your cycle.  The copper IUD does not interfere with your female hormones.  The hormone IUD can make heavy menstrual periods lighter and decrease cramping.  The hormone IUD can be used for 3 or 5 years.  The copper IUD can be used for 10 years. Disadvantages of an intrauterine device  The hormone IUD can be associated with irregular bleeding patterns.  The copper IUD can make your menstrual flow heavier and more painful.  You may experience cramping and vaginal bleeding after insertion. This information is not intended to replace advice given to you by your health care provider. Make sure you discuss any questions you have with your health care provider. Document Released: 01/07/2004 Document Revised: 07/11/2015 Document Reviewed: 07/24/2012 Elsevier Interactive Patient Education  2017 [employer] Contraception Choices Contraception, also called birth control, refers to methods or devices that prevent pregnancy. Hormonal methods Contraceptive implant A contraceptive implant is a thin,  plastic tube that contains a hormone. It is inserted into the upper part of the arm. It can remain in place for up to 3 years. Progestin-only  injections Progestin-only injections are injections of progestin, a synthetic form of the hormone progesterone. They are given every 3 months by a health care provider. Birth control pills Birth control pills are pills that contain hormones that prevent pregnancy. They must be taken once a day, preferably at the same time each day. Birth control patch The birth control patch contains hormones that prevent pregnancy. It is placed on the skin and must be changed once a week for three weeks and removed on the fourth week. A prescription is needed to use this method of contraception. Vaginal ring A vaginal ring contains hormones that prevent pregnancy. It is placed in the vagina for three weeks and removed on the fourth week. After that, the process is repeated with a new ring. A prescription is needed to use this method of contraception. Emergency contraceptive Emergency contraceptives prevent pregnancy after unprotected sex. They come in pill form and can be taken up to 5 days after sex. They work best the sooner they are taken after having sex. Most emergency contraceptives are available without a prescription. This method should not be used as your only form of birth control. Barrier methods Female condom A female condom is a thin sheath that is worn over the penis during sex. Condoms keep sperm from going inside a woman's body. They can be used with a spermicide to increase their effectiveness. They should be disposed after a single use. Female condom A female condom is a soft, loose-fitting sheath that is put into the vagina before sex. The condom keeps sperm from going inside a woman's body. They should be disposed after a single use.  Intrauterine contraception Intrauterine device (IUD) An IUD is a T-shaped device that is put in a woman's uterus. There are two types:  Hormone IUD.This type contains progestin, a synthetic form of the hormone progesterone. This type can stay in place for 3-5  years.  Copper IUD.This type is wrapped in copper wire. It can stay in place for 10 years.  Permanent methods of contraception Female tubal ligation In this method, a woman's fallopian tubes are sealed, tied, or blocked during surgery to prevent eggs from traveling to the uterus. Hysteroscopic sterilization In this method, a small, flexible insert is placed into each fallopian tube. The inserts cause scar tissue to form in the fallopian tubes and block them, so sperm cannot reach an egg. The procedure takes about 3 months to be effective. Another form of birth control must be used during those 3 months. Female sterilization This is a procedure to tie off the tubes that carry sperm (vasectomy). After the procedure, the man can still ejaculate fluid (semen). Natural planning methods Natural family planning In this method, a couple does not have sex on days when the woman could become pregnant. Calendar method This means keeping track of the length of each menstrual cycle, identifying the days when pregnancy can happen, and not having sex on those days. Ovulation method In this method, a couple avoids sex during ovulation. Symptothermal method This method involves not having sex during ovulation. The woman typically checks for ovulation by watching changes in her temperature and in the consistency of cervical mucus. Post-ovulation method In this method, a couple waits to have sex until after ovulation. Summary  Contraception, also called birth control, means methods or devices that prevent  pregnancy.  Hormonal methods of contraception include implants, injections, pills, patches, vaginal rings, and emergency contraceptives.  Barrier methods of contraception can include female condoms, female condoms, diaphragms, cervical caps, sponges, and spermicides.  There are two types of IUDs (intrauterine devices). An IUD can be put in a woman's uterus to prevent pregnancy for 3-5 years.  Permanent  sterilization can be done through a procedure for males, females, or both.  Natural family planning methods involve not having sex on days when the woman could become pregnant. This information is not intended to replace advice given to you by your health care provider. Make sure you discuss any questions you have with your health care provider. Document Released: 02/02/2005 Document Revised: 03/07/2016 Document Reviewed: 03/07/2016 Elsevier Interactive Patient Education  2018 [employer].

## 2017-05-04 NOTE — Progress Notes (Signed)
   PRENATAL VISIT NOTE  Subjective:  Allison Heath is a 22 y.o. G1P0 at 4754w2d being seen today for ongoing prenatal care.  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  Patient reports occasional contractions.  Contractions: Irregular. Vag. Bleeding: None.  Movement: Present. Denies leaking of fluid.   The following portions of the patient's history were reviewed and updated as appropriate: allergies, current medications, past family history, past medical history, past social history, past surgical history and problem list. Problem list updated.  Objective:   Vitals:   05/04/17 1042  BP: 128/80  Pulse: 83  Weight: 201 lb 11.2 oz (91.5 kg)    Fetal Status: Fetal Heart Rate (bpm): 140   Movement: Present     General:  Alert, oriented and cooperative. Patient is in no acute distress.  Skin: Skin is warm and dry. No rash noted.   Cardiovascular: Normal heart rate noted  Respiratory: Normal respiratory effort, no problems with respiration noted  Abdomen: Soft, gravid, appropriate for gestational age.  Pain/Pressure: Present     Pelvic: Cervical exam deferred        Extremities: Normal range of motion.  Edema: Trace  Mental Status:  Normal mood and affect. Normal behavior. Normal judgment and thought content.   Assessment and Plan:  Pregnancy: G1P0 at 1854w2d  1. Supervision of normal first pregnancy, antepartum Reviewed options for birth control including oral contraceptive pills (combination and progesterone only), NuvaRing, Depo-Provera, Nexplanon, IUDs (copper and levonorgestrol). Thoroughly reviewed risks/benefits/side effects of each. Answered all questions.   Preterm labor symptoms and general obstetric precautions including but not limited to vaginal bleeding, contractions, leaking of fluid and fetal movement were reviewed in  detail with the patient. Please refer to After Visit Summary for other counseling recommendations.  Return in about 1 week (around 05/11/2017) for OB visit.   Conan BowensKelly M Genelda Roark, MD

## 2017-05-11 ENCOUNTER — Ambulatory Visit (INDEPENDENT_AMBULATORY_CARE_PROVIDER_SITE_OTHER): Payer: Medicaid Other | Admitting: Obstetrics & Gynecology

## 2017-05-11 VITALS — BP 129/89 | HR 76 | Wt 204.9 lb

## 2017-05-11 DIAGNOSIS — Z3400 Encounter for supervision of normal first pregnancy, unspecified trimester: Secondary | ICD-10-CM

## 2017-05-11 NOTE — Progress Notes (Signed)
   PRENATAL VISIT NOTE  Subjective:  Allison Heath is a 22 y.o. G1P0 at 470w2d being seen today for ongoing prenatal care.  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  Patient reports feet swelling.  Contractions: Irregular. Vag. Bleeding: None.  Movement: Present. Denies leaking of fluid. She denies headaches, visual changes or epigastric/RUQ pain.  The following portions of the patient's history were reviewed and updated as appropriate: allergies, current medications, past family history, past medical history, past social history, past surgical history and problem list. Problem list updated.  Objective:   Vitals:   05/11/17 1114  BP: 129/89  Pulse: 76  Weight: 204 lb 14.4 oz (92.9 kg)    Fetal Status:     Movement: Present     General:  Alert, oriented and cooperative. Patient is in no acute distress.  Skin: Skin is warm and dry. No rash noted.   Cardiovascular: Normal heart rate noted  Respiratory: Normal respiratory effort, no problems with respiration noted  Abdomen: Soft, gravid, appropriate for gestational age.  Pain/Pressure: Present     Pelvic: Cervical exam performed        Extremities: Normal range of motion.  Edema: Deep pitting, indentation remains for a short time  Mental Status:  Normal mood and affect. Normal behavior. Normal judgment and thought content.   DTR- 1 + bilateral and equal  Assessment and Plan:  Pregnancy: G1P0 at 9270w2d  1. Supervision of normal first pregnancy, antepartum   Term labor symptoms and general obstetric precautions including but not limited to vaginal bleeding, contractions, leaking of fluid and fetal movement were reviewed in detail with the patient. Please refer to After Visit Summary for other counseling recommendations.  No follow-ups on file.   Allison BossierMyra C Leyah Bocchino, MD

## 2017-05-12 ENCOUNTER — Inpatient Hospital Stay (HOSPITAL_COMMUNITY)
Admission: AD | Admit: 2017-05-12 | Discharge: 2017-05-15 | DRG: 807 | Disposition: A | Discharge status: Home / Self Care | Payer: Medicaid Other | Source: Ambulatory Visit | Attending: Family Medicine | Admitting: Family Medicine

## 2017-05-12 ENCOUNTER — Encounter (HOSPITAL_COMMUNITY): Payer: Self-pay | Admitting: *Deleted

## 2017-05-12 DIAGNOSIS — O133 Gestational [pregnancy-induced] hypertension without significant proteinuria, third trimester: Secondary | ICD-10-CM

## 2017-05-12 DIAGNOSIS — O134 Gestational [pregnancy-induced] hypertension without significant proteinuria, complicating childbirth: Principal | ICD-10-CM | POA: Diagnosis present

## 2017-05-12 DIAGNOSIS — Z3A38 38 weeks gestation of pregnancy: Secondary | ICD-10-CM

## 2017-05-12 DIAGNOSIS — O139 Gestational [pregnancy-induced] hypertension without significant proteinuria, unspecified trimester: Secondary | ICD-10-CM | POA: Diagnosis present

## 2017-05-12 DIAGNOSIS — Z87891 Personal history of nicotine dependence: Secondary | ICD-10-CM

## 2017-05-12 HISTORY — DX: Gestational (pregnancy-induced) hypertension without significant proteinuria, unspecified trimester: O13.9

## 2017-05-12 HISTORY — DX: Personal history of other diseases of the nervous system and sense organs: Z86.69

## 2017-05-12 NOTE — H&P (Addendum)
Allison Heath is a 22 y.o. female presenting for Uterine contractions since 9pm   BPs noted to be elevated, so will admit for Gestational Hypertension  Denies H/A or vision changes  Does have increased swelling   RN Note: PT SAYS PNC  WITH [facility]-    FIRST TIME BP WAS ELEVATED IN OFFICE .     SAYS  SHE WENT TO HOSPITAL IN [place] ON SAT FOR SWELLING-  BP 148/98.      SAYS UC STRONG SINCE  9PM.    DENIES HSV AND MRSA.  GBS- NEG      VE IN OFFICE  3 CM- STRIPPED MEMBRANES   . OB History    Gravida  1   Para      Term      Preterm      AB      Living        SAB      TAB      Ectopic      Multiple      Live Births             Past Medical History:  Diagnosis Date  . Medical history non-contributory   . Supervision of normal first pregnancy, antepartum 11/03/2016    Clinic [facility] Prenatal Labs Dating LMP Blood type:    Genetic Screen 1 Screen:    AFP:     Quad:     NIPS: Antibody:  Anatomic [place]  Rubella:   GTT Early:               Third trimester:  RPR:    Flu vaccine  HBsAg:    TDaP vaccine                                               Rhogam: HIV:    Baby Food Breast                                              GBS: (For PCN allergy, check sensitivities) Contraception Pill? Pap: Circumcision Yes if a boy  Pediatrician Undecided CF: Support Person [facility] Prenatal Classes Yes Hgb electrophoresis:     Past Surgical History:  Procedure Laterality Date  . birth mark removal  1997  . SPINAL CORD UNTETHERING  2006   Family History: family history includes Addison's disease in her mother; Bone cancer in her maternal grandmother; Emphysema in her maternal grandfather; Fibromyalgia in her mother; Luiz Blare' disease in her mother; Multiple sclerosis in her mother. Social History:  reports that she quit smoking about 9 months ago. She has never used smokeless tobacco. She reports that she does not drink alcohol or use drugs.     Maternal Diabetes: No Genetic  Screening: Normal Maternal Ultrasounds/Referrals: Normal Fetal Ultrasounds or other Referrals:  None Maternal Substance Abuse:  No Significant Maternal Medications:  None Significant Maternal Lab Results:  Lab values include: Group B Strep negative Other Comments:  New Gestational Hypertension  Review of Systems  Constitutional: Negative for chills and fever.  Eyes: Negative for blurred vision.  Respiratory: Negative for shortness of breath.   Cardiovascular: Positive for leg swelling (and hand swelling).  Gastrointestinal: Positive for abdominal pain. Negative for constipation, diarrhea, nausea and  vomiting.  Neurological: Negative for dizziness and focal weakness.   Maternal Medical History:  Reason for admission: Contractions.  Nausea.  Contractions: Onset was 3-5 hours ago.   Frequency: regular.   Perceived severity is moderate.    Fetal activity: Perceived fetal activity is normal.   Last perceived fetal movement was within the past hour.    Prenatal complications: PIH.   No bleeding or preterm labor.   Prenatal Complications - Diabetes: none.    Dilation: 3 Effacement (%): 60 Station: -3 Exam by:: Lauren Fields RN  Blood pressure (!) 121/103, pulse 82, temperature 98.3 F (36.8 C), temperature source Oral, resp. rate 18, height 5\' 3"  (1.6 m), weight 207 lb (93.9 kg), last menstrual period 08/19/2016, SpO2 96 %. Maternal Exam:  Uterine Assessment: Contraction strength is moderate.  Contraction frequency is irregular.   Abdomen: Patient reports no abdominal tenderness. Estimated fetal weight is 7.   Fetal presentation: vertex  Introitus: Normal vulva. Normal vagina.  Ferning test: not done.  Nitrazine test: not done.  Cervix: Cervix evaluated by digital exam.     Fetal Exam Fetal Monitor Review: Mode: ultrasound.   Baseline rate: 130.  Variability: moderate (6-25 bpm).   Pattern: accelerations present and no decelerations.    Fetal State Assessment:  Category I - tracings are normal.     Physical Exam  Constitutional: She is oriented to person, place, and time. She appears well-developed and well-nourished. No distress.  HENT:  Head: Normocephalic.  Cardiovascular: Normal rate and regular rhythm.  Respiratory: Effort normal. No respiratory distress.  GI: Soft. She exhibits no distension. There is no tenderness. There is no rebound and no guarding.  Genitourinary:  Genitourinary Comments: Dilation: 3 Effacement (%): 60 Station: -3 Presentation: Vertex Exam by:: Rockwell Germany RN    Musculoskeletal: Normal range of motion. She exhibits edema (upper and lower).  Neurological: She is alert and oriented to person, place, and time. She displays abnormal reflex (3+ brisk, no clonus). She exhibits normal muscle tone.  Skin: Skin is warm and dry.  Psychiatric: She has a normal mood and affect.    Labs:   Results for orders placed or performed during the hospital encounter of 05/12/17 (from the past 24 hour(s))  CBC     Status: Abnormal   Collection Time: 05/13/17 12:01 AM  Result Value Ref Range   WBC 8.7 4.0 - 10.5 K/uL   RBC 3.36 (L) 3.87 - 5.11 MIL/uL   Hemoglobin 11.4 (L) 12.0 - 15.0 g/dL   HCT 16.1 (L) 09.6 - 04.5 %   MCV 98.8 78.0 - 100.0 fL   MCH 33.9 26.0 - 34.0 pg   MCHC 34.3 30.0 - 36.0 g/dL   RDW 40.9 81.1 - 91.4 %   Platelets 168 150 - 400 K/uL  Comprehensive metabolic panel     Status: Abnormal   Collection Time: 05/13/17 12:01 AM  Result Value Ref Range   Sodium 136 135 - 145 mmol/L   Potassium 4.1 3.5 - 5.1 mmol/L   Chloride 106 101 - 111 mmol/L   CO2 21 (L) 22 - 32 mmol/L   Glucose, Bld 105 (H) 65 - 99 mg/dL   BUN 9 6 - 20 mg/dL   Creatinine, Ser 7.82 0.44 - 1.00 mg/dL   Calcium 8.8 (L) 8.9 - 10.3 mg/dL   Total Protein 6.7 6.5 - 8.1 g/dL   Albumin 3.2 (L) 3.5 - 5.0 g/dL   AST 21 15 - 41 U/L   ALT 15 14 -  54 U/L   Alkaline Phosphatase 191 (H) 38 - 126 U/L   Total Bilirubin 0.3 0.3 - 1.2 mg/dL   GFR calc  non Af Amer >60 >60 mL/min   GFR calc Af Amer >60 >60 mL/min   Anion gap 9 5 - 15   Protein/Creat pending  Prenatal labs: ABO, Rh: CANCELED/CANCELED/-- (01/15 0835) Antibody: CANCELED (01/15 0835) Rubella: CANCELED (01/15 [id number]) RPR: Non Reactive (01/15 1039)  HBsAg: CANCELED (01/15 [id number])  HIV: Non Reactive (01/15 1039)  GBS: Negative (03/12 1213)   Assessment/Plan: Single IUP at 164w4d Gestational Hypertension, r/o Preeclampsia Latent phase labor  [facility]duction/augmentation of labor Preeclampsia labs ordered    Wynelle BourgeoisMarie Marlis Oldaker 05/12/2017, 11:46 PM

## 2017-05-12 NOTE — MAU Note (Signed)
Pt reports contractions intermittently all day but 3-5 mins over the last hour. States membranes were stripped yesterday. Pt denies LOF or vaginal bleeding. Reports good fetal movement.

## 2017-05-12 NOTE — MAU Note (Signed)
PT SAYS PNC  WITH [facility]-    FIRST TIME BP WAS ELEVATED IN OFFICE .     SAYS  SHE WENT TO HOSPITAL IN [place] ON SAT FOR SWELLING-  BP 148/98.      SAYS UC STRONG SINCE  9PM.    DENIES HSV AND MRSA.  GBS- NEG      VE IN OFFICE  3 CM- STRIPPED MEMBRANES

## 2017-05-13 ENCOUNTER — Inpatient Hospital Stay (HOSPITAL_COMMUNITY): Payer: Medicaid Other | Admitting: Anesthesiology

## 2017-05-13 ENCOUNTER — Encounter (HOSPITAL_COMMUNITY): Payer: Self-pay | Admitting: Obstetrics and Gynecology

## 2017-05-13 ENCOUNTER — Other Ambulatory Visit: Payer: Self-pay

## 2017-05-13 DIAGNOSIS — O139 Gestational [pregnancy-induced] hypertension without significant proteinuria, unspecified trimester: Secondary | ICD-10-CM

## 2017-05-13 DIAGNOSIS — Z87891 Personal history of nicotine dependence: Secondary | ICD-10-CM | POA: Diagnosis not present

## 2017-05-13 DIAGNOSIS — O134 Gestational [pregnancy-induced] hypertension without significant proteinuria, complicating childbirth: Secondary | ICD-10-CM | POA: Diagnosis present

## 2017-05-13 DIAGNOSIS — Z3A38 38 weeks gestation of pregnancy: Secondary | ICD-10-CM

## 2017-05-13 HISTORY — DX: Gestational (pregnancy-induced) hypertension without significant proteinuria, unspecified trimester: O13.9

## 2017-05-13 LAB — COMPREHENSIVE METABOLIC PANEL
ALBUMIN: 3.2 g/dL — AB (ref 3.5–5.0)
ALK PHOS: 191 U/L — AB (ref 38–126)
ALT: 15 U/L (ref 14–54)
ANION GAP: 9 (ref 5–15)
AST: 21 U/L (ref 15–41)
BUN: 9 mg/dL (ref 6–20)
CALCIUM: 8.8 mg/dL — AB (ref 8.9–10.3)
CO2: 21 mmol/L — AB (ref 22–32)
Chloride: 106 mmol/L (ref 101–111)
Creatinine, Ser: 0.53 mg/dL (ref 0.44–1.00)
GFR calc Af Amer: >60 mL/min (ref >60)
GFR calc non Af Amer: >60 mL/min (ref >60)
GLUCOSE: 105 mg/dL — AB (ref 65–99)
Potassium: 4.1 mmol/L (ref 3.5–5.1)
SODIUM: 136 mmol/L (ref 135–145)
Total Bilirubin: 0.3 mg/dL (ref 0.3–1.2)
Total Protein: 6.7 g/dL (ref 6.5–8.1)

## 2017-05-13 LAB — CBC
HEMATOCRIT: 33.2 % — AB (ref 36.0–46.0)
HEMATOCRIT: 34.2 % — AB (ref 36.0–46.0)
Hemoglobin: 11.4 g/dL — ABNORMAL LOW (ref 12.0–15.0)
Hemoglobin: 11.5 g/dL — ABNORMAL LOW (ref 12.0–15.0)
MCH: 33.2 pg (ref 26.0–34.0)
MCH: 33.9 pg (ref 26.0–34.0)
MCHC: 33.6 g/dL (ref 30.0–36.0)
MCHC: 34.3 g/dL (ref 30.0–36.0)
MCV: 98.8 fL (ref 78.0–100.0)
MCV: 98.8 fL (ref 78.0–100.0)
Platelets: 161 10*3/uL (ref 150–400)
Platelets: 168 10*3/uL (ref 150–400)
RBC: 3.36 MIL/uL — AB (ref 3.87–5.11)
RBC: 3.46 MIL/uL — AB (ref 3.87–5.11)
RDW: 13.2 % (ref 11.5–15.5)
RDW: 13.4 % (ref 11.5–15.5)
WBC: 15.1 10*3/uL — AB (ref 4.0–10.5)
WBC: 8.7 10*3/uL (ref 4.0–10.5)

## 2017-05-13 LAB — TYPE AND SCREEN
ABO/RH(D): O NEG
ANTIBODY SCREEN: NEGATIVE
WEAK D: POSITIVE

## 2017-05-13 LAB — PROTEIN / CREATININE RATIO, URINE
Creatinine, Urine: 96 mg/dL
PROTEIN CREATININE RATIO: 0.21 mg/mg{creat} — AB (ref 0.00–0.15)
Total Protein, Urine: 20 mg/dL

## 2017-05-13 LAB — ABO/RH: ABO/RH(D): O NEG

## 2017-05-13 LAB — RPR: RPR: NONREACTIVE

## 2017-05-13 MED ORDER — LIDOCAINE HCL (PF) 1 % IJ SOLN
30.0000 mL | INTRAMUSCULAR | Status: DC | PRN
  Filled 2017-05-13: qty 30

## 2017-05-13 MED ORDER — WITCH HAZEL-GLYCERIN EX PADS
1.0000 "application " | MEDICATED_PAD | CUTANEOUS | Status: DC | PRN
  Administered 2017-05-15: 1 via TOPICAL

## 2017-05-13 MED ORDER — EPHEDRINE 5 MG/ML INJ
10.0000 mg | INTRAVENOUS | Status: DC | PRN
  Filled 2017-05-13: qty 2

## 2017-05-13 MED ORDER — EPHEDRINE 5 MG/ML INJ: 10.0000 mg | INTRAVENOUS | Status: DC | PRN

## 2017-05-13 MED ORDER — OXYTOCIN BOLUS FROM INFUSION
500.0000 mL | Freq: Once | INTRAVENOUS | Status: AC
  Administered 2017-05-13: 500 mL via INTRAVENOUS

## 2017-05-13 MED ORDER — ACETAMINOPHEN 325 MG PO TABS
650.0000 mg | ORAL_TABLET | ORAL | Status: DC | PRN
  Administered 2017-05-13: 650 mg via ORAL
  Filled 2017-05-13: qty 2

## 2017-05-13 MED ORDER — TERBUTALINE SULFATE 1 MG/ML IJ SOLN
0.2500 mg | Freq: Once | INTRAMUSCULAR | Status: DC | PRN
  Filled 2017-05-13: qty 1

## 2017-05-13 MED ORDER — PHENYLEPHRINE 40 MCG/ML (10ML) SYRINGE FOR IV PUSH (FOR BLOOD PRESSURE SUPPORT)
PREFILLED_SYRINGE | INTRAVENOUS | Status: AC
  Filled 2017-05-13: qty 20

## 2017-05-13 MED ORDER — ZOLPIDEM TARTRATE 5 MG PO TABS: 5.0000 mg | ORAL_TABLET | Freq: Every evening | ORAL | Status: DC | PRN

## 2017-05-13 MED ORDER — LACTATED RINGERS IV SOLN: 500.0000 mL | Freq: Once | INTRAVENOUS | Status: DC

## 2017-05-13 MED ORDER — SOD CITRATE-CITRIC ACID 500-334 MG/5ML PO SOLN
30.0000 mL | ORAL | Status: DC | PRN
  Administered 2017-05-13 (×2): 30 mL via ORAL
  Filled 2017-05-13 (×2): qty 15

## 2017-05-13 MED ORDER — BENZOCAINE-MENTHOL 20-0.5 % EX AERO
1.0000 "application " | INHALATION_SPRAY | CUTANEOUS | Status: DC | PRN
  Administered 2017-05-14 – 2017-05-15 (×2): 1 via TOPICAL
  Filled 2017-05-13 (×2): qty 56

## 2017-05-13 MED ORDER — PHENYLEPHRINE 40 MCG/ML (10ML) SYRINGE FOR IV PUSH (FOR BLOOD PRESSURE SUPPORT)
80.0000 ug | PREFILLED_SYRINGE | INTRAVENOUS | Status: DC | PRN
  Filled 2017-05-13: qty 5

## 2017-05-13 MED ORDER — OXYTOCIN 40 UNITS IN LACTATED RINGERS INFUSION - SIMPLE MED
2.5000 [IU]/h | INTRAVENOUS | Status: DC
  Administered 2017-05-13: 2.5 [IU]/h via INTRAVENOUS
  Filled 2017-05-13: qty 1000

## 2017-05-13 MED ORDER — COCONUT OIL OIL: 1.0000 "application " | TOPICAL_OIL | Status: DC | PRN

## 2017-05-13 MED ORDER — ONDANSETRON HCL 4 MG PO TABS: 4.0000 mg | ORAL_TABLET | ORAL | Status: DC | PRN

## 2017-05-13 MED ORDER — LACTATED RINGERS IV SOLN: 500.0000 mL | INTRAVENOUS | Status: DC | PRN

## 2017-05-13 MED ORDER — OXYCODONE-ACETAMINOPHEN 5-325 MG PO TABS: 1.0000 | ORAL_TABLET | ORAL | Status: DC | PRN

## 2017-05-13 MED ORDER — PHENYLEPHRINE 40 MCG/ML (10ML) SYRINGE FOR IV PUSH (FOR BLOOD PRESSURE SUPPORT): 80.0000 ug | PREFILLED_SYRINGE | INTRAVENOUS | Status: DC | PRN

## 2017-05-13 MED ORDER — ONDANSETRON HCL 4 MG/2ML IJ SOLN: 4.0000 mg | INTRAMUSCULAR | Status: DC | PRN

## 2017-05-13 MED ORDER — TETANUS-DIPHTH-ACELL PERTUSSIS 5-2.5-18.5 LF-MCG/0.5 IM SUSP: 0.5000 mL | Freq: Once | INTRAMUSCULAR | Status: DC

## 2017-05-13 MED ORDER — LIDOCAINE HCL (PF) 1 % IJ SOLN
INTRAMUSCULAR | Status: DC | PRN
  Administered 2017-05-13: 6 mL via EPIDURAL

## 2017-05-13 MED ORDER — ONDANSETRON HCL 4 MG/2ML IJ SOLN
4.0000 mg | Freq: Four times a day (QID) | INTRAMUSCULAR | Status: DC | PRN
  Administered 2017-05-13 (×2): 4 mg via INTRAVENOUS
  Filled 2017-05-13 (×2): qty 2

## 2017-05-13 MED ORDER — IBUPROFEN 600 MG PO TABS
600.0000 mg | ORAL_TABLET | Freq: Four times a day (QID) | ORAL | Status: DC
  Administered 2017-05-13 – 2017-05-15 (×5): 600 mg via ORAL
  Filled 2017-05-13 (×8): qty 1

## 2017-05-13 MED ORDER — FENTANYL 2.5 MCG/ML BUPIVACAINE 1/10 % EPIDURAL INFUSION (WH - ANES)
14.0000 mL/h | INTRAMUSCULAR | Status: DC | PRN
  Administered 2017-05-13 (×2): 14 mL/h via EPIDURAL
  Filled 2017-05-13: qty 100

## 2017-05-13 MED ORDER — FENTANYL 2.5 MCG/ML BUPIVACAINE 1/10 % EPIDURAL INFUSION (WH - ANES)
INTRAMUSCULAR | Status: AC
  Filled 2017-05-13: qty 100

## 2017-05-13 MED ORDER — MISOPROSTOL 200 MCG PO TABS
ORAL_TABLET | ORAL | Status: AC
  Filled 2017-05-13: qty 4

## 2017-05-13 MED ORDER — DIPHENHYDRAMINE HCL 50 MG/ML IJ SOLN
12.5000 mg | INTRAMUSCULAR | Status: DC | PRN
  Administered 2017-05-13: 12.5 mg via INTRAVENOUS
  Filled 2017-05-13: qty 1

## 2017-05-13 MED ORDER — MISOPROSTOL 200 MCG PO TABS
ORAL_TABLET | ORAL | Status: AC
  Filled 2017-05-13: qty 1

## 2017-05-13 MED ORDER — OXYCODONE-ACETAMINOPHEN 5-325 MG PO TABS: 2.0000 | ORAL_TABLET | ORAL | Status: DC | PRN

## 2017-05-13 MED ORDER — PRENATAL MULTIVITAMIN CH
1.0000 | ORAL_TABLET | Freq: Every day | ORAL | Status: DC
  Administered 2017-05-14 – 2017-05-15 (×2): 1 via ORAL
  Filled 2017-05-13 (×2): qty 1

## 2017-05-13 MED ORDER — LACTATED RINGERS IV SOLN
INTRAVENOUS | Status: DC
  Administered 2017-05-13 (×3): via INTRAVENOUS

## 2017-05-13 MED ORDER — SIMETHICONE 80 MG PO CHEW: 80.0000 mg | CHEWABLE_TABLET | ORAL | Status: DC | PRN

## 2017-05-13 MED ORDER — ACETAMINOPHEN 325 MG PO TABS
650.0000 mg | ORAL_TABLET | ORAL | Status: DC | PRN
  Administered 2017-05-14 (×2): 650 mg via ORAL
  Filled 2017-05-13 (×2): qty 2

## 2017-05-13 MED ORDER — DIPHENHYDRAMINE HCL 25 MG PO CAPS: 25.0000 mg | ORAL_CAPSULE | Freq: Four times a day (QID) | ORAL | Status: DC | PRN

## 2017-05-13 MED ORDER — SENNOSIDES-DOCUSATE SODIUM 8.6-50 MG PO TABS
2.0000 | ORAL_TABLET | ORAL | Status: DC
  Administered 2017-05-13: 2 via ORAL
  Filled 2017-05-13 (×2): qty 2

## 2017-05-13 MED ORDER — MISOPROSTOL 200 MCG PO TABS
800.0000 ug | ORAL_TABLET | Freq: Once | ORAL | Status: AC
  Administered 2017-05-13: 800 ug via RECTAL

## 2017-05-13 MED ORDER — OXYTOCIN 40 UNITS IN LACTATED RINGERS INFUSION - SIMPLE MED
1.0000 m[IU]/min | INTRAVENOUS | Status: DC
  Administered 2017-05-13: 2 m[IU]/min via INTRAVENOUS

## 2017-05-13 MED ORDER — DIBUCAINE 1 % RE OINT: 1.0000 "application " | TOPICAL_OINTMENT | RECTAL | Status: DC | PRN

## 2017-05-13 NOTE — Progress Notes (Signed)
Patient ID: Allison PrudeVictoria Heath, female   DOB: 09-19-1995, 22 y.o.   MRN: [id number] Got epidural about 4am. Now comfortable  Vitals:   05/13/17 0435 05/13/17 0440 05/13/17 0445 05/13/17 0457  BP: 128/79 123/82 132/75 125/79  Pulse: (!) 102 (!) 102 96 71  Resp: 18 18 16 18   Temp:  97.7 F (36.5 C)    TempSrc:  Oral    SpO2: 98% 98% 99%   Weight:      Height:       FHR reactive, category I UCs every 2 min  Dilation: 3 Effacement (%): 60 Station: -3 Presentation: Vertex Exam by:: Southern CompanySavannah Brendle RN  Anticipate active labor

## 2017-05-13 NOTE — Progress Notes (Signed)
Labor Progress Note Lynnae PrudeVictoria Pavelko is a 22 y.o. G1P0 at 3661w4d presented for IOL for GHTN S: Patient comfortable with epidural in place  O:  BP 118/71   Pulse 87   Temp 98 F (36.7 C) (Axillary)   Resp 18   Ht 5\' 3"  (1.6 m)   Wt 207 lb (93.9 kg)   LMP 08/19/2016 (Approximate)   SpO2 99%   BMI 36.67 kg/m  EFM: 135/moderate/+accel no decel  CVE: Dilation: 3 Effacement (%): 70 Cervical Position: Middle Station: -2 Presentation: Vertex Exam by:: Suzette BattiestVeronica CNM   A&P: 22 y.o. G1P0 7961w4d IOL for GHTN  #Labor: Arrest of dilation. FB placed @1043 - continue pitocin titration  #Pain: Epidural in place #FWB: Cat I #GBS negative #GHTN- PreE labs normal  Sharyon CableVeronica C Zuri Lascala, CNM 12:52 PM

## 2017-05-13 NOTE — Anesthesia Preprocedure Evaluation (Signed)
Anesthesia Evaluation  Patient identified by MRN, date of birth, ID band Patient awake    Reviewed: Allergy & Precautions, NPO status , Patient's Chart, lab work & pertinent test results  Airway Mallampati: II  TM Distance: >3 FB Neck ROM: Full    Dental no notable dental hx.    Pulmonary neg pulmonary ROS, former smoker,    Pulmonary exam normal breath sounds clear to auscultation       Cardiovascular hypertension, Normal cardiovascular exam Rhythm:Regular Rate:Normal     Neuro/Psych negative neurological ROS     GI/Hepatic   Endo/Other    Renal/GU      Musculoskeletal   Abdominal   Peds  Hematology   Anesthesia Other Findings   Reproductive/Obstetrics (+) Pregnancy                             Lab Results  Component Value Date   WBC 8.7 05/13/2017   HGB 11.4 (L) 05/13/2017   HCT 33.2 (L) 05/13/2017   MCV 98.8 05/13/2017   PLT 168 05/13/2017    Anesthesia Physical Anesthesia Plan  ASA: III  Anesthesia Plan: Epidural   Post-op Pain Management:    Induction:   PONV Risk Score and Plan:   Airway Management Planned:   Additional Equipment:   Intra-op Plan:   Post-operative Plan:   Informed Consent: I have reviewed the patients History and Physical, chart, labs and discussed the procedure including the risks, benefits and alternatives for the proposed anesthesia with the patient or authorized representative who has indicated his/her understanding and acceptance.     Plan Discussed with:   Anesthesia Plan Comments:         Anesthesia Quick Evaluation

## 2017-05-13 NOTE — Anesthesia Pain Management Evaluation Note (Signed)
  CRNA Pain Management Visit Note  Patient: Allison Heath, 22 y.o., female  "Hello I am a member of the anesthesia team at [facility]. We have an anesthesia team available at all times to provide care throughout the hospital, including epidural management and anesthesia for C-section. I don't know your plan for the delivery whether it a natural birth, water birth, IV sedation, nitrous supplementation, doula or epidural, but we want to meet your pain goals."   1.Was your pain managed to your expectations on prior hospitalizations?   No prior hospitalizations  2.What is your expectation for pain management during this hospitalization?     Epidural  3.How can we help you reach that goal? Epidural in place  Record the patient's initial score and the patient's pain goal.   Pain: 0  Pain Goal: 3 [facility] wants you to be able to say your pain was always managed very well.  Sway Guttierrez 05/13/2017

## 2017-05-13 NOTE — Progress Notes (Signed)
Patient ID: Allison Heath, female   DOB: 31-May-1995, 22 y.o.   MRN: [id number] Vitals:   05/12/17 2346 05/13/17 0037 05/13/17 0111 05/13/17 0213  BP: (!) 133/93 (!) 146/96 (!) 146/96 131/80  Pulse: 95 92 84 83  Resp:  18 18 18   Temp:  98.2 F (36.8 C)    TempSrc:  Oral    SpO2:      Weight:      Height:       Results for orders placed or performed during the hospital encounter of 05/12/17 (from the past 24 hour(s))  CBC     Status: Abnormal   Collection Time: 05/13/17 12:01 AM  Result Value Ref Range   WBC 8.7 4.0 - 10.5 K/uL   RBC 3.36 (L) 3.87 - 5.11 MIL/uL   Hemoglobin 11.4 (L) 12.0 - 15.0 g/dL   HCT 56.233.2 (L) 13.036.0 - 86.546.0 %   MCV 98.8 78.0 - 100.0 fL   MCH 33.9 26.0 - 34.0 pg   MCHC 34.3 30.0 - 36.0 g/dL   RDW 78.413.4 69.611.5 - 29.515.5 %   Platelets 168 150 - 400 K/uL  Comprehensive metabolic panel     Status: Abnormal   Collection Time: 05/13/17 12:01 AM  Result Value Ref Range   Sodium 136 135 - 145 mmol/L   Potassium 4.1 3.5 - 5.1 mmol/L   Chloride 106 101 - 111 mmol/L   CO2 21 (L) 22 - 32 mmol/L   Glucose, Bld 105 (H) 65 - 99 mg/dL   BUN 9 6 - 20 mg/dL   Creatinine, Ser 2.840.53 0.44 - 1.00 mg/dL   Calcium 8.8 (L) 8.9 - 10.3 mg/dL   Total Protein 6.7 6.5 - 8.1 g/dL   Albumin 3.2 (L) 3.5 - 5.0 g/dL   AST 21 15 - 41 U/L   ALT 15 14 - 54 U/L   Alkaline Phosphatase 191 (H) 38 - 126 U/L   Total Bilirubin 0.3 0.3 - 1.2 mg/dL   GFR calc non Af Amer >60 >60 mL/min   GFR calc Af Amer >60 >60 mL/min   Anion gap 9 5 - 15  Type and screen [facility]     Status: None (Preliminary result)   Collection Time: 05/13/17 12:01 AM  Result Value Ref Range   ABO/RH(D) PENDING    Antibody Screen NEG    Sample Expiration      05/16/2017 Performed at [facility], [address] [place], [place] [id number]    Weak D PENDING   Protein / creatinine ratio, urine     Status: Abnormal   Collection Time: 05/13/17 12:08 AM  Result Value Ref Range   Creatinine, Urine 96.00  mg/dL   Total Protein, Urine 20 mg/dL   Protein Creatinine Ratio 0.21 (H) 0.00 - 0.15 mg/mg[Cre]   FHR reactive, Category I UCs q2-4 min  Continue Pitocin

## 2017-05-13 NOTE — Progress Notes (Signed)
LABOR PROGRESS NOTE  Allison Heath is a 22 y.o. G1P0 at 6866w4d  admitted for IOL for GHTN  Subjective: Patient comfortable with epidural- not feeling any pressure or contractions  Objective: BP 118/71   Pulse 87   Temp 98 F (36.7 C) (Axillary)   Resp 18   Ht 5\' 3"  (1.6 m)   Wt 207 lb (93.9 kg)   LMP 08/19/2016 (Approximate)   SpO2 99%   BMI 36.67 kg/m  or  Vitals:   05/13/17 1246 05/13/17 1301 05/13/17 1331 05/13/17 1401  BP: 113/60 (!) 102/52 119/76 118/71  Pulse: 78 76 98 87  Resp: 18 16 16 18   Temp:      TempSrc:      SpO2:      Weight:      Height:        AROM @1406   Dilation: 6 Effacement (%): 80 Cervical Position: Middle Station: -1 Presentation: Vertex Exam by:: Guss Farruggia FHT: baseline rate 130, moderate varibility, +acel, no decel Toco: 2-4 minutes/ moderate by palpation   Labs: Lab Results  Component Value Date   WBC 8.7 05/13/2017   HGB 11.4 (L) 05/13/2017   HCT 33.2 (L) 05/13/2017   MCV 98.8 05/13/2017   PLT 168 05/13/2017    Patient Active Problem List   Diagnosis Date Noted  . Gestational hypertension 05/13/2017  . Supervision of normal first pregnancy, antepartum 11/03/2016  . 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 G1P0 at 5066w4d here for IOL for GHTN   Labor: Progressing well. AROM @1406 - continue pitocin titration  Fetal Wellbeing:  Cat I Pain Control:  Epidural  Anticipated MOD:  SVD  Sharyon CableRogers, Chalice Philbert C, CNM 05/13/2017, 2:56 PM

## 2017-05-13 NOTE — Anesthesia Procedure Notes (Signed)
Epidural Patient location during procedure: OB Start time: 05/13/2017 4:04 AM End time: 05/13/2017 4:20 AM  Staffing Anesthesiologist: Trevor IhaHouser, Stephen A, MD Performed: anesthesiologist   Preanesthetic Checklist Completed: patient identified, site marked, surgical consent, pre-op evaluation, timeout performed, IV checked, risks and benefits discussed and monitors and equipment checked  Epidural Patient position: sitting Prep: site prepped and draped and DuraPrep Patient monitoring: continuous pulse ox and blood pressure Approach: midline Location: L3-L4 Injection technique: LOR air  Needle:  Needle type: Tuohy  Needle gauge: 17 G Needle length: 9 cm and 9 Needle insertion depth: 5 cm cm Catheter type: closed end flexible Catheter size: 19 Gauge Catheter at skin depth: 10 cm Test dose: negative  Assessment Events: blood not aspirated, injection not painful, no injection resistance, negative IV test and no paresthesia

## 2017-05-14 LAB — CBC
HEMATOCRIT: 29.9 % — AB (ref 36.0–46.0)
Hemoglobin: 10.2 g/dL — ABNORMAL LOW (ref 12.0–15.0)
MCH: 33.3 pg (ref 26.0–34.0)
MCHC: 34.1 g/dL (ref 30.0–36.0)
MCV: 97.7 fL (ref 78.0–100.0)
Platelets: 161 10*3/uL (ref 150–400)
RBC: 3.06 MIL/uL — ABNORMAL LOW (ref 3.87–5.11)
RDW: 13.3 % (ref 11.5–15.5)
WBC: 12.8 10*3/uL — AB (ref 4.0–10.5)

## 2017-05-14 LAB — KLEIHAUER-BETKE STAIN
# Vials RhIg: 1
Fetal Cells %: 0 %
Quantitation Fetal Hemoglobin: 0 mL

## 2017-05-14 MED ORDER — RHO D IMMUNE GLOBULIN 1500 UNIT/2ML IJ SOSY
300.0000 ug | PREFILLED_SYRINGE | Freq: Once | INTRAMUSCULAR | Status: AC
Start: 2017-05-14 — End: 2017-05-14
  Administered 2017-05-14: 300 ug via INTRAVENOUS
  Filled 2017-05-14: qty 2

## 2017-05-14 NOTE — Anesthesia Postprocedure Evaluation (Signed)
Anesthesia Post Note  Patient: Allison Heath  Procedure(s) Performed: AN AD HOC LABOR EPIDURAL     Patient location during evaluation: Mother Baby Anesthesia Type: Epidural Level of consciousness: awake and alert Pain management: pain level controlled Vital Signs Assessment: post-procedure vital signs reviewed and stable Respiratory status: spontaneous breathing Cardiovascular status: blood pressure returned to baseline Postop Assessment: no headache, patient able to bend at knees, no backache, no apparent nausea or vomiting, epidural receding and adequate PO intake Anesthetic complications: no    Last Vitals:  Vitals:   05/13/17 2340 05/14/17 0340  BP: 136/75 126/61  Pulse: 95 64  Resp: 18 18  Temp: (!) 38.1 C 36.8 C  SpO2: 98% 100%    Last Pain:  Vitals:   05/14/17 0340  TempSrc: Oral  PainSc: 5    Pain Goal:                 Salome ArntSterling, Gil Ingwersen Marie

## 2017-05-14 NOTE — Progress Notes (Signed)
Post Partum Day 1 Subjective: no complaints, tolerating PO and sleeping this am  Objective: Blood pressure 126/61, pulse 64, temperature 98.3 F (36.8 C), temperature source Oral, resp. rate 18, height 5\' 3"  (1.6 m), weight 207 lb (93.9 kg), last menstrual period 08/19/2016, SpO2 100 %, unknown if currently breastfeeding.  Physical Exam:  General: alert, cooperative and appears stated age Lochia: appropriate Uterine Fundus: firm non-tender DVT Evaluation: No evidence of DVT seen on physical exam.  Recent Labs    05/13/17 2227 05/14/17 0527  HGB 11.5* 10.2*  HCT 34.2* 29.9*    Assessment/Plan: Plan for discharge tomorrow, Breastfeeding and Contraception undecided   LOS: 1 day   Reva Boresanya S Daissy Yerian 05/14/2017, 7:13 AM

## 2017-05-14 NOTE — Lactation Note (Signed)
This note was copied from a baby's chart. Lactation Consultation Note  Patient Name: Allison Heath Today's Date: 05/14/2017 Reason for consult: Initial assessment;Primapara;1st time breastfeeding;Early term 1737-38.6wks  1721 hours old female who is being exclusively BF by his mother, she's a P1. Per mom BF is going well, she states that feedings at the breast are comfortable and that she heard swallows when baby is feeding. Also, per mom both nipples are intact; [facility] [place] fully BF package.  Offered assistance with latch but mom politely declined saying that baby just fed less than two hours ago; baby was asleep and swaddled in grandma's arms. Encouraged mom to feed baby on cues STS at least 8-12 times in 24 hours. Also, suggested to give baby an opportunity to feed every 3 hours if not showing cues placing him STS. Reviewed BF brochure, BF resources and feeding diary, mom is aware of LC services and will call PRN.  Maternal Data Has patient been taught Hand Expression?: Yes Does the patient have breastfeeding experience prior to this delivery?: No  Feeding Feeding Type: Breast Fed Length of feed: 22 min  Interventions Interventions: Breast feeding basics reviewed;Hand pump  Lactation Tools Discussed/Used Tools: Pump Breast pump type: Manual WIC Program: Yes Pump Review: Setup, frequency, and cleaning Initiated by:: Mpeck Date initiated:: 05/14/17   Consult Status Consult Status: Follow-up Date: 05/15/17 Follow-up type: In-patient    Allison Heath 05/14/2017, 5:39 PM

## 2017-05-15 LAB — RH IG WORKUP (INCLUDES ABO/RH)
ABO/RH(D): O NEG
Gestational Age(Wks): 38
Unit division: 0

## 2017-05-15 LAB — BIRTH TISSUE RECOVERY COLLECTION (PLACENTA DONATION)

## 2017-05-15 MED ORDER — IBUPROFEN 600 MG PO TABS: 600.0000 mg | ORAL_TABLET | Freq: Four times a day (QID) | ORAL | 0 refills | Status: AC

## 2017-05-15 NOTE — Progress Notes (Deleted)
Pt refused A.M  Motrin. Pt states that she is not hurting and will let RN knows if she needs med. Motrin returned to pyxis.

## 2017-05-15 NOTE — Discharge Summary (Signed)
OB Discharge Summary     Patient Name: Allison Heath DOB: 02-21-1995 MRN: [id number]  Date of admission: 05/12/2017 Delivering MD: Sharyon Cable   Date of discharge: 05/15/2017  Admitting diagnosis: 39WKS CTX Intrauterine pregnancy: [redacted]w[redacted]d     Secondary diagnosis:  Principal Problem:   SVD (spontaneous vaginal delivery) Active Problems:   Gestational hypertension  Additional problems: None     Discharge diagnosis: Term Pregnancy Delivered and Gestational Hypertension                                                                                                Post partum procedures:Rhogam  Augmentation: AROM, Pitocin and Foley Balloon  Complications: None  Hospital course:  Induction of Labor With Vaginal Delivery   22 y.o. yo G1P1001 at [redacted]w[redacted]d was admitted to the hospital 05/12/2017 for induction of labor.  Indication for induction: Gestational hypertension.  Patient had an uncomplicated labor course as follows: Membrane Rupture Time/Date: 2:06 PM ,05/13/2017   Intrapartum Procedures: Episiotomy: None [1]                                         Lacerations:  2nd degree [3];Labial [10]  Patient had delivery of a Viable infant.  Information for the patient's newborn:  Allison, Heath [[id number]]  Delivery Method: Vag-Spont   05/13/2017  Details of delivery can be found in separate delivery note.  Patient had a routine postpartum course. Patient has no complaints of headache, nausea/vomiting, visual disturbances or any other complaints. BPs were normal postpartum. Patient is discharged home 05/15/17.   Physical exam  Vitals:   05/14/17 0340 05/14/17 0840 05/14/17 1926 05/15/17 0554  BP: 126/61 134/70 129/78 129/64  Pulse: 64 79  60  Resp: 18 18 18 18   Temp: 98.3 F (36.8 C) 98.4 F (36.9 C) 98.2 F (36.8 C) 98 F (36.7 C)  TempSrc: Oral Oral Oral Oral  SpO2: 100% 98%    Weight:      Height:       General: alert, cooperative and no distress Lochia:  appropriate Uterine Fundus: firm Incision: N/A DVT Evaluation: No evidence of DVT seen on physical exam. Labs: Lab Results  Component Value Date   WBC 12.8 (H) 05/14/2017   HGB 10.2 (L) 05/14/2017   HCT 29.9 (L) 05/14/2017   MCV 97.7 05/14/2017   PLT 161 05/14/2017   CMP Latest Ref Rng & Units 05/13/2017  Glucose 65 - 99 mg/dL 782(N)  BUN 6 - 20 mg/dL 9  Creatinine 5.62 - 1.30 mg/dL 8.65  Sodium 784 - 696 mmol/L 136  Potassium 3.5 - 5.1 mmol/L 4.1  Chloride 101 - 111 mmol/L 106  CO2 22 - 32 mmol/L 21(L)  Calcium 8.9 - 10.3 mg/dL 2.9(B)  Total Protein 6.5 - 8.1 g/dL 6.7  Total Bilirubin 0.3 - 1.2 mg/dL 0.3  Alkaline Phos 38 - 126 U/L 191(H)  AST 15 - 41 U/L 21  ALT 14 - 54 U/L 15    Discharge instruction: per  After Visit Summary and "Baby and Me Booklet".  After visit meds:  Allergies as of 05/15/2017   No Known Allergies     Medication List    TAKE these medications   COMFORT FIT MATERNITY SUPP MED Misc 1 Device by Does not apply route daily.   ibuprofen 600 MG tablet Commonly known as:  ADVIL,MOTRIN Take 1 tablet (600 mg total) by mouth every 6 (six) hours.   pantoprazole 20 MG tablet Commonly known as:  PROTONIX Take 1 tablet (20 mg total) by mouth daily.   prenatal multivitamin Tabs tablet Take 1 tablet by mouth daily.       Diet: routine diet  Activity: Advance as tolerated. Pelvic rest for 6 weeks.   Outpatient follow up:4 weeks Follow up Appt: Future Appointments  Date Time Provider Department Center  06/09/2017  1:30 PM Constant, Gigi GinPeggy, MD [facility] None   Follow up Visit:No follow-ups on file.  Postpartum contraception: Progesterone only pills  Newborn Data: Live born female  Birth Weight: 8 lb 12.4 oz (3980 g) APGAR: 8, 9  Newborn Delivery   Time head delivered:  05/13/2017 20:38:00 Birth date/time:  05/13/2017 20:39:00 Delivery type:  Vaginal, Spontaneous     Baby Feeding: Breast Disposition:home with mother   05/15/2017 Allison MornHannah E  Heath, Medical Student   OB FELLOW DISCHARGE ATTESTATION  I have seen and examined this patient. I agree with above documentation and have made edits as needed.   Allison AdaJazma Deshia Vanderhoof, DO OB Fellow 8:38 AM

## 2017-05-15 NOTE — Lactation Note (Signed)
This note was copied from a baby's chart. Lactation Consultation Note  Patient Name: Allison Heath ZOXWR'UToday's Date: 05/15/2017 Reason for consult: Follow-up assessment   P1, Baby 37 hours old.   Mother hand expressed good flow before latching. Assisted w/ football hold and compression.  Sucks and swallows observed. Mom encouraged to feed baby 8-12 times/24 hours and with feeding cues.  Reviewed engorgement care and monitoring voids/stools.    Maternal Data    Feeding Feeding Type: Breast Fed  LATCH Score Latch: Grasps breast easily, tongue down, lips flanged, rhythmical sucking.  Audible Swallowing: Spontaneous and intermittent  Type of Nipple: Everted at rest and after stimulation  Comfort (Breast/Nipple): Soft / non-tender  Hold (Positioning): Assistance needed to correctly position infant at breast and maintain latch.  LATCH Score: 9  Interventions Interventions: Breast feeding basics reviewed;Assisted with latch;Support pillows;Hand express  Lactation Tools Discussed/Used     Consult Status Consult Status: Complete    Hardie PulleyBerkelhammer, Ruth Boschen 05/15/2017, 10:33 AM

## 2017-05-15 NOTE — Discharge Instructions (Signed)

## 2017-05-15 NOTE — Progress Notes (Signed)
Pt refused morning motrin. Pt states that she is not hurting and will call out if and when she needs it. Motrin returned to pyxis.

## 2017-05-17 ENCOUNTER — Telehealth: Payer: Self-pay

## 2017-05-17 NOTE — Telephone Encounter (Signed)
Pt called stating that she passed a blood clot a little bit bigger than the size of a golf ball. Pt states that this only happened one time. Pt advised to monitor bleeding, and if it happens again or bleeding gets worse to be evaluated at [facility]

## 2017-05-19 ENCOUNTER — Encounter: Payer: Medicaid Other | Admitting: Obstetrics & Gynecology

## 2017-06-09 ENCOUNTER — Ambulatory Visit (INDEPENDENT_AMBULATORY_CARE_PROVIDER_SITE_OTHER): Payer: Medicaid Other | Admitting: Obstetrics and Gynecology

## 2017-06-09 ENCOUNTER — Encounter: Payer: Self-pay | Admitting: Obstetrics and Gynecology

## 2017-06-09 DIAGNOSIS — Z1389 Encounter for screening for other disorder: Secondary | ICD-10-CM | POA: Diagnosis not present

## 2017-06-09 MED ORDER — NORETHINDRONE 0.35 MG PO TABS: 1.0000 | ORAL_TABLET | Freq: Every day | ORAL | 11 refills | Status: AC

## 2017-06-09 NOTE — Progress Notes (Signed)
..  Post Partum Exam  Allison Heath is a 22 y.o. 631P1001 female who presents for a postpartum visit. She is 3 weeks postpartum following a spontaneous vaginal delivery. I have fully reviewed the prenatal and intrapartum course. The delivery was at 38 gestational weeks due to gestational hypertension.  Anesthesia: epidural. Postpartum course has been good.  Baby's course has been good. Baby is feeding by breast. Bleeding - none. Bowel function is normal. Bladder function is normal. Patient is not sexually active. Contraception method will possibly be birth control pills. Postpartum depression screening:neg    Last pap smear done 10/2016 and was Normal  Review of Systems Pertinent items are noted in HPI.    Objective:  Blood pressure 114/74, pulse 81, temperature 99.9 F (37.7 C), temperature source Oral, resp. rate 16, weight 181 lb 11.2 oz (82.4 kg), currently breastfeeding.  General:  alert, cooperative and no distress   Breasts:  inspection negative, no nipple discharge or bleeding, no masses or nodularity palpable  Lungs: clear to auscultation bilaterally  Heart:  regular rate and rhythm  Abdomen: soft, non-tender; bowel sounds normal; no masses,  no organomegaly   Vulva:  normal  Vagina: normal vagina, no discharge, exudate, lesion, or erythema  Cervix:  multiparous appearance  Corpus: normal size, contour, position, consistency, mobility, non-tender  Adnexa:  normal adnexa and no mass, fullness, tenderness  Rectal Exam: Not performed.        Assessment:    Normal postpartum exam. Pap smear not done at today's visit.   Plan:   1. Contraception: oral progesterone-only contraceptive 2. Patient is medically cleared to resume all activities of daily living 3. Follow up in: 6 months or as needed.

## 2019-06-20 IMAGING — US US MFM FETAL NUCHAL TRANSLUCENCY
1 series · 15 of 28 positions shown · non-contrast
Comparison: none

[Series 1: us mfm fetal nuchal translucency · 15 of 54 slices shown]
[im 1/54]
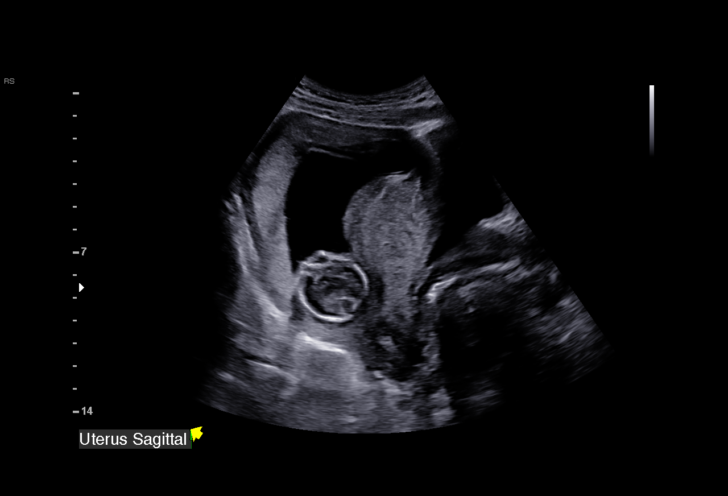
[im 4/54]
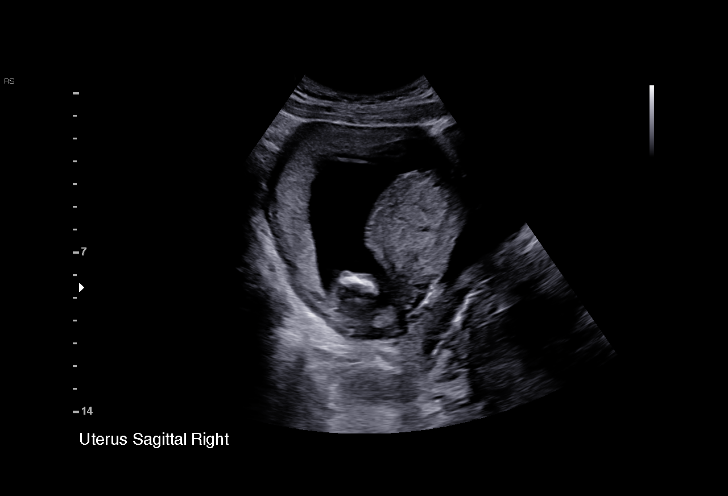
[im 8/54]
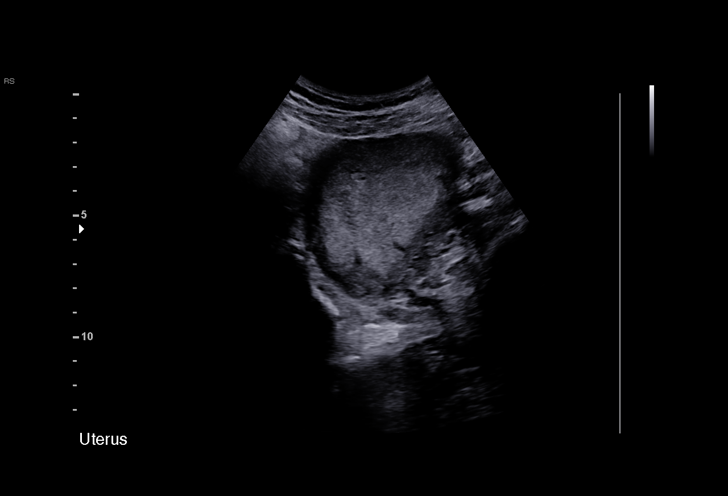
[im 12/54]
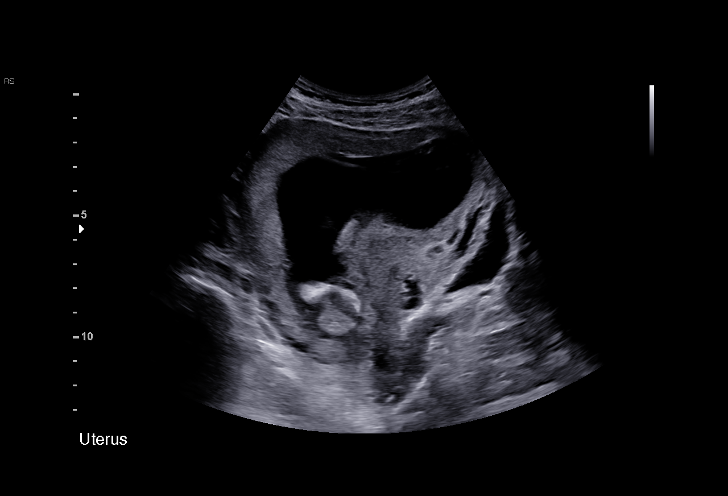
[im 16/54]
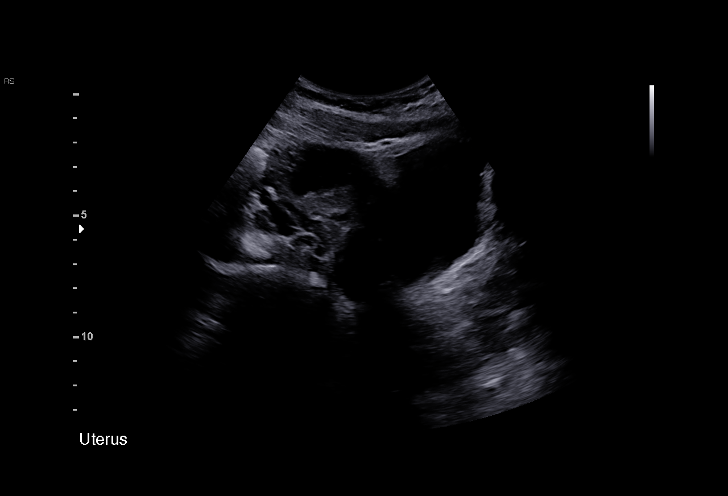
[im 20/54]
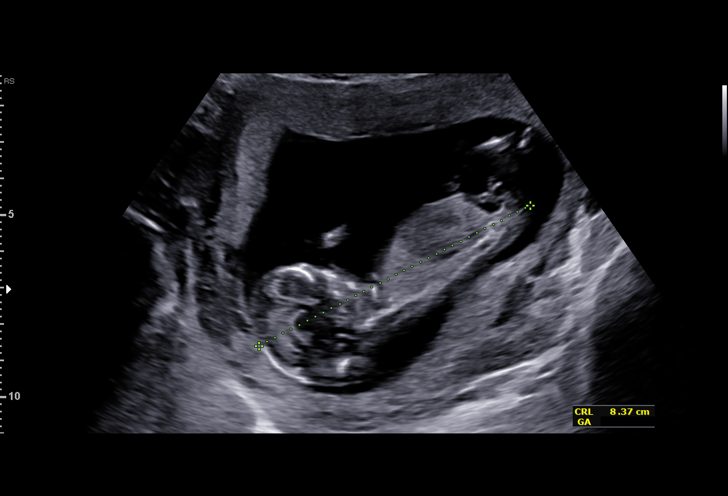
[im 24/54]
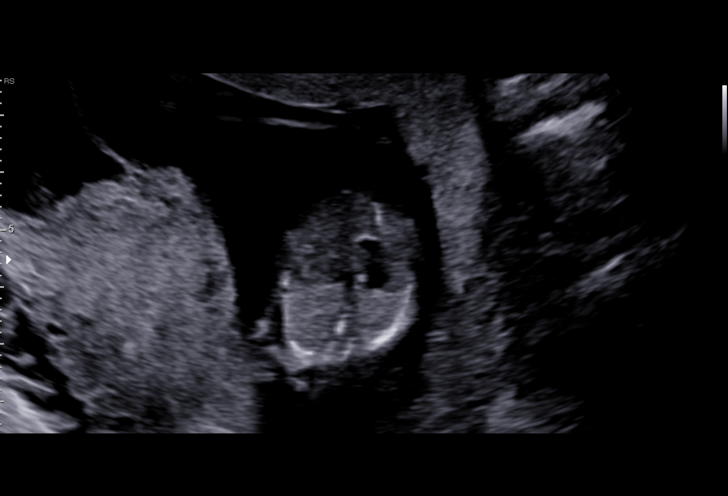
[im 28/54]
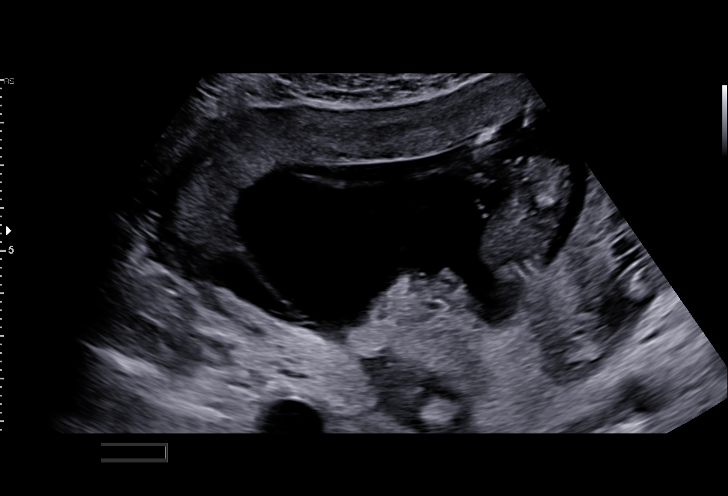
[im 30/54]
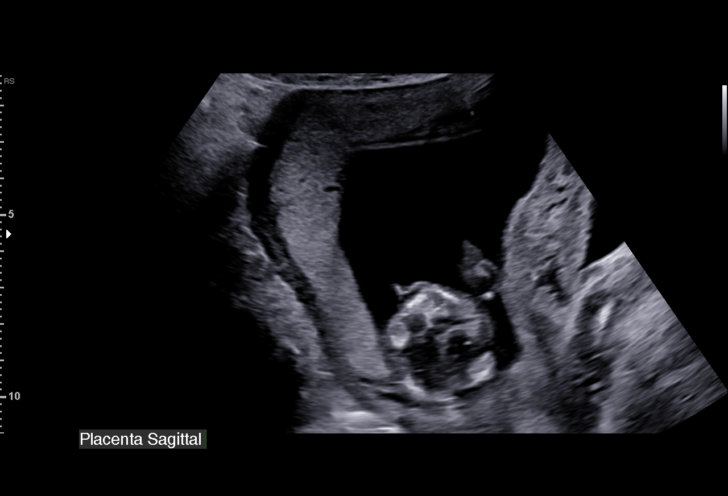
[im 34/54]
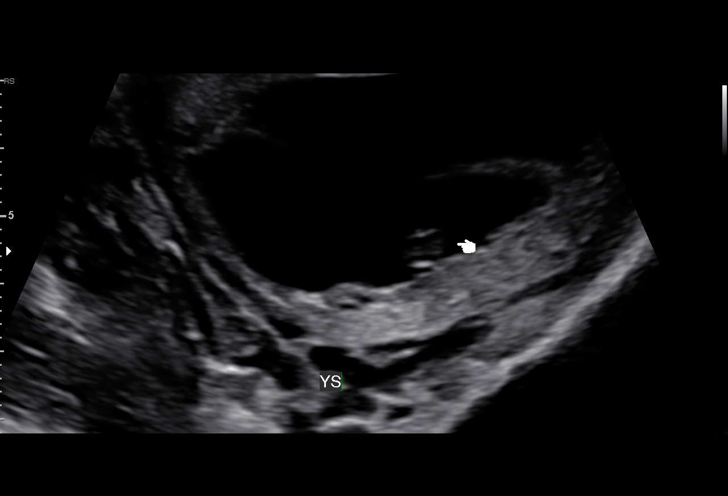
[im 38/54]
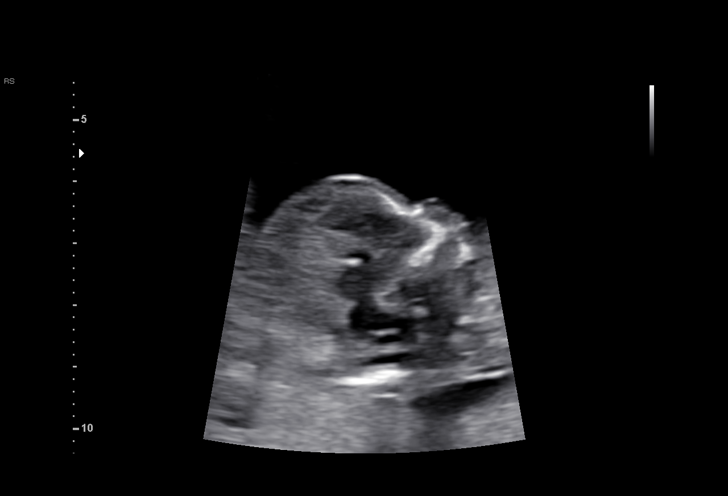
[im 42/54]
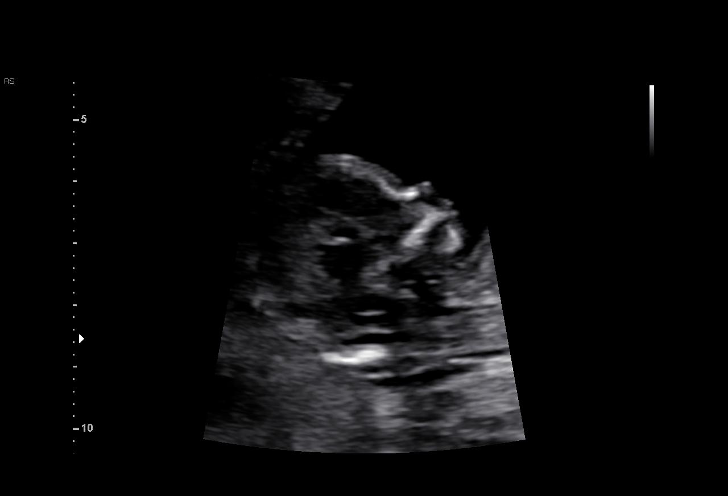
[im 46/54]
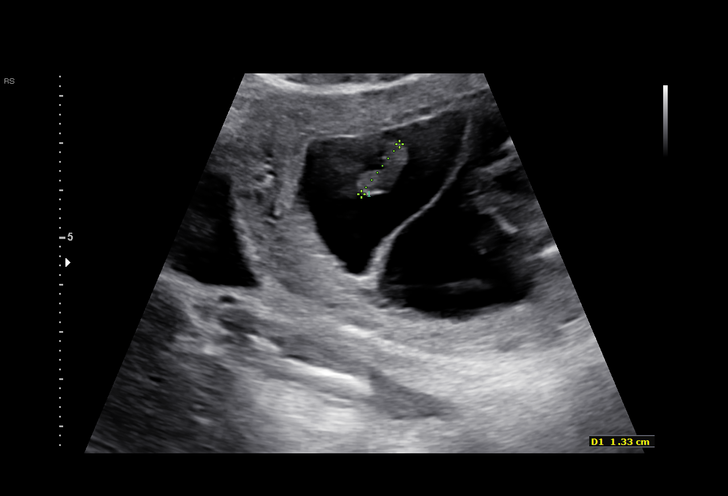
[im 50/54]
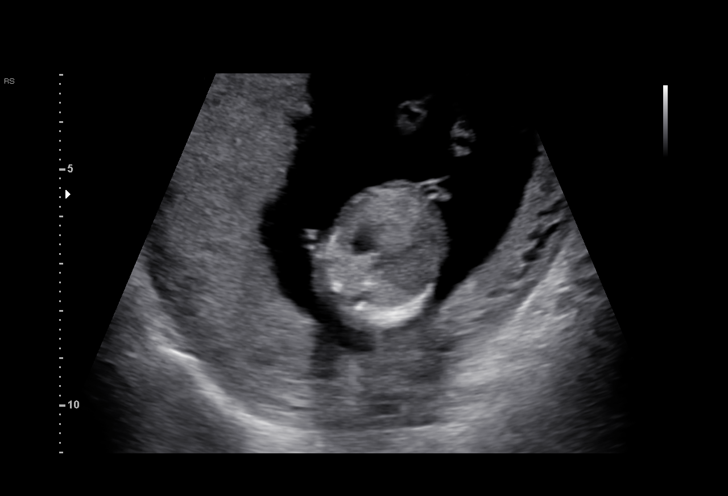
[im 54/54]
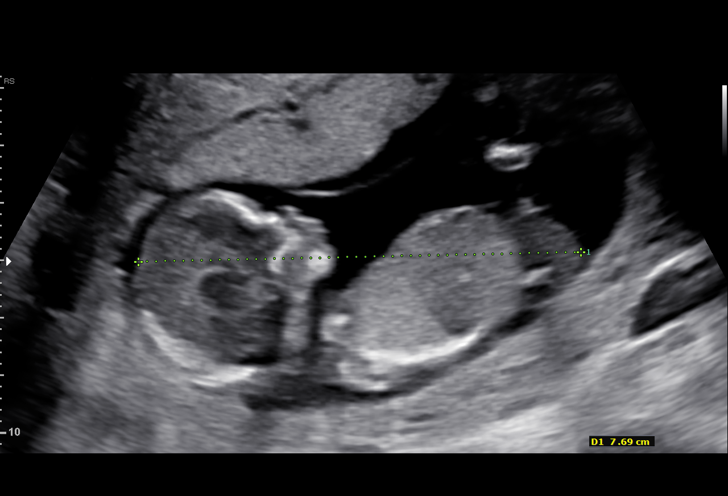

[15 of 28 positions shown; findings below may reference images not displayed]

TRANSLUCENCY

1  OLVIN OMAR OLLUO             [id number]      [id number]     [id number]
Indications

13 weeks gestation of pregnancy
Encounter for nuchal translucency
OB History

Gravidity:    1
Fetal Evaluation

Num Of Fetuses:     1
Fetal Heart         149
Rate(bpm):
Cardiac Activity:   Observed
Presentation:       Variable

Amniotic Fluid
AFI FV:      Subjectively within normal limits
Biometry

CRL:      83.3  mm     G. Age:  13w 6d                  EDD:   05/20/17
Gestational Age

LMP:           13w 0d        Date:  08/19/16                 EDD:   05/26/17
Best:          13w 0d     Det. By:  LMP  (08/19/16)          EDD:   05/26/17
1st Trimester Genetic Sonogram Screening

CRL:            76.9  mm    G. Age:   13w 3d                 EDD:   05/23/17
Nuc Trans:       2.3  mm
Nasal Bone:                 Present
Anatomy

Cranium:               Appears normal         Bladder:                Appears normal
Choroid Plexus:        Appears normal         Upper Extremities:      Visualized
Stomach:               Appears normal         Lower Extremities:      Visualized
Kidneys:               Appear normal
Cervix Uterus Adnexa

Cervix
Normal appearance by transabdominal scan.

Uterus
No abnormality visualized.

Left Ovary
Not visualized.

Right Ovary
Within normal limits.

Adnexa:       No abnormality visualized. No adnexal mass
visualized.
Impression

SIUP at 13+0 weeks
No gross abnormalities identified
NT measurement was within normal limits for this GA; NB
present
Normal amniotic fluid volume
Measurements consistent with LMP dating
Second gestational sac visualized containing a 1.3 cm non-
viable fetus; early demise of co-twin of a di/di twin pregnancy
Recommendations

Offer MSAFP in the second trimester for ONTD screening
Offer detailed U/S by 18 weeks
